# Patient Record
Sex: Male | Born: 1935 | Race: White | Hispanic: No | Marital: Married | Smoking: Never smoker
Health system: Southern US, Community
[De-identification: ages and names within clinical notes are randomized; demographics above are authoritative.]

## PROBLEM LIST (undated history)

## (undated) DIAGNOSIS — I639 Cerebral infarction, unspecified: Secondary | ICD-10-CM

## (undated) DIAGNOSIS — I10 Essential (primary) hypertension: Secondary | ICD-10-CM

## (undated) DIAGNOSIS — F0390 Unspecified dementia without behavioral disturbance: Secondary | ICD-10-CM

---

## 2005-01-23 ENCOUNTER — Ambulatory Visit: Payer: Self-pay | Admitting: Oncology

## 2005-02-19 ENCOUNTER — Ambulatory Visit: Payer: Self-pay | Admitting: Oncology

## 2005-03-06 ENCOUNTER — Ambulatory Visit: Payer: Self-pay | Admitting: Urology

## 2005-08-04 ENCOUNTER — Ambulatory Visit: Payer: Self-pay | Admitting: Oncology

## 2006-01-29 ENCOUNTER — Ambulatory Visit: Payer: Self-pay | Admitting: Oncology

## 2006-02-02 ENCOUNTER — Ambulatory Visit: Payer: Self-pay | Admitting: Oncology

## 2006-02-19 ENCOUNTER — Ambulatory Visit: Payer: Self-pay | Admitting: Oncology

## 2006-03-19 ENCOUNTER — Ambulatory Visit: Payer: Self-pay | Admitting: Urology

## 2006-03-19 ENCOUNTER — Other Ambulatory Visit: Payer: Self-pay

## 2006-03-26 ENCOUNTER — Ambulatory Visit: Payer: Self-pay | Admitting: Urology

## 2006-05-05 ENCOUNTER — Ambulatory Visit: Payer: Self-pay | Admitting: Urology

## 2006-05-12 ENCOUNTER — Ambulatory Visit: Payer: Self-pay | Admitting: Urology

## 2006-05-19 ENCOUNTER — Ambulatory Visit: Payer: Self-pay | Admitting: Urology

## 2006-05-26 ENCOUNTER — Ambulatory Visit: Payer: Self-pay | Admitting: Urology

## 2006-06-09 ENCOUNTER — Ambulatory Visit: Payer: Self-pay | Admitting: Urology

## 2006-06-16 ENCOUNTER — Ambulatory Visit: Payer: Self-pay | Admitting: Urology

## 2006-08-05 ENCOUNTER — Ambulatory Visit: Payer: Self-pay | Admitting: Oncology

## 2006-08-21 ENCOUNTER — Ambulatory Visit: Payer: Self-pay | Admitting: General Surgery

## 2006-08-31 ENCOUNTER — Ambulatory Visit: Payer: Self-pay | Admitting: General Surgery

## 2006-09-01 ENCOUNTER — Ambulatory Visit: Payer: Self-pay | Admitting: General Surgery

## 2007-02-03 ENCOUNTER — Ambulatory Visit: Payer: Self-pay | Admitting: Oncology

## 2007-02-20 ENCOUNTER — Ambulatory Visit: Payer: Self-pay | Admitting: Oncology

## 2007-06-03 ENCOUNTER — Ambulatory Visit: Payer: Self-pay | Admitting: Oncology

## 2007-06-09 ENCOUNTER — Ambulatory Visit: Payer: Self-pay | Admitting: Oncology

## 2007-06-22 ENCOUNTER — Ambulatory Visit: Payer: Self-pay | Admitting: Oncology

## 2007-07-01 ENCOUNTER — Ambulatory Visit: Payer: Self-pay | Admitting: Urology

## 2007-07-01 ENCOUNTER — Other Ambulatory Visit: Payer: Self-pay

## 2007-07-06 ENCOUNTER — Ambulatory Visit: Payer: Self-pay | Admitting: Urology

## 2007-08-11 ENCOUNTER — Ambulatory Visit: Payer: Self-pay | Admitting: Unknown Physician Specialty

## 2007-08-13 ENCOUNTER — Ambulatory Visit: Payer: Self-pay | Admitting: Unknown Physician Specialty

## 2007-09-22 ENCOUNTER — Ambulatory Visit: Payer: Self-pay | Admitting: Oncology

## 2007-10-07 ENCOUNTER — Ambulatory Visit: Payer: Self-pay | Admitting: Oncology

## 2007-10-23 ENCOUNTER — Ambulatory Visit: Payer: Self-pay | Admitting: Oncology

## 2007-11-23 ENCOUNTER — Ambulatory Visit: Payer: Self-pay | Admitting: Urology

## 2008-03-22 ENCOUNTER — Ambulatory Visit: Payer: Self-pay | Admitting: Oncology

## 2008-04-06 ENCOUNTER — Ambulatory Visit: Payer: Self-pay | Admitting: Oncology

## 2008-04-21 ENCOUNTER — Ambulatory Visit: Payer: Self-pay | Admitting: Oncology

## 2008-10-18 ENCOUNTER — Ambulatory Visit: Payer: Self-pay | Admitting: Oncology

## 2008-10-22 ENCOUNTER — Ambulatory Visit: Payer: Self-pay | Admitting: Oncology

## 2008-11-21 ENCOUNTER — Ambulatory Visit: Payer: Self-pay | Admitting: Oncology

## 2008-12-18 ENCOUNTER — Ambulatory Visit: Payer: Self-pay | Admitting: Oncology

## 2008-12-20 ENCOUNTER — Ambulatory Visit: Payer: Self-pay | Admitting: Oncology

## 2008-12-22 ENCOUNTER — Ambulatory Visit: Payer: Self-pay | Admitting: Oncology

## 2010-07-12 ENCOUNTER — Ambulatory Visit: Payer: Self-pay | Admitting: Urology

## 2011-02-03 ENCOUNTER — Inpatient Hospital Stay: Payer: Self-pay | Admitting: Specialist

## 2012-08-24 ENCOUNTER — Emergency Department: Payer: Self-pay | Admitting: Emergency Medicine

## 2013-03-30 ENCOUNTER — Ambulatory Visit: Payer: Self-pay | Admitting: Urology

## 2013-03-30 DIAGNOSIS — I2510 Atherosclerotic heart disease of native coronary artery without angina pectoris: Secondary | ICD-10-CM

## 2013-03-30 LAB — BASIC METABOLIC PANEL
BUN: 25 mg/dL — ABNORMAL HIGH (ref 7–18)
Calcium, Total: 8.8 mg/dL (ref 8.5–10.1)
Co2: 28 mmol/L (ref 21–32)
Creatinine: 1.29 mg/dL (ref 0.60–1.30)
EGFR (African American): 59 — ABNORMAL LOW
Glucose: 113 mg/dL — ABNORMAL HIGH (ref 65–99)
Osmolality: 286 (ref 275–301)
Sodium: 141 mmol/L (ref 136–145)

## 2013-03-30 LAB — CBC WITH DIFFERENTIAL/PLATELET
Basophil #: 0 10*3/uL (ref 0.0–0.1)
Basophil %: 0.5 %
Eosinophil #: 0.4 10*3/uL (ref 0.0–0.7)
Eosinophil %: 5.7 %
Lymphocyte %: 26.2 %
MCH: 31.3 pg (ref 26.0–34.0)
MCV: 93 fL (ref 80–100)
Monocyte %: 9.6 %
Neutrophil %: 58 %
RBC: 4.4 10*6/uL (ref 4.40–5.90)
RDW: 13.3 % (ref 11.5–14.5)

## 2013-04-05 ENCOUNTER — Ambulatory Visit: Payer: Self-pay | Admitting: Urology

## 2015-02-21 ENCOUNTER — Ambulatory Visit: Payer: Self-pay | Admitting: Internal Medicine

## 2015-04-13 NOTE — Op Note (Signed)
PATIENT NAME:  Ryan Payne, Ryan Payne MR#:  [id number] DATE OF BIRTH:  May 25, 1926  DATE OF PROCEDURE:  04/05/2013  PREOPERATIVE DIAGNOSIS: Bulbar urethral stricture.   POSTOPERATIVE DIAGNOSIS: Bulbar urethral stricture.   PROCEDURE: Visual internal urethrotomy; steroid injection.   SURGEON: Dr. Edrick Oh.   ANESTHESIA: Laryngeal mask airway anesthesia.   INDICATIONS: The patient is an 81 year old gentleman with a history of urethral stricture disease. He has had progressive narrowing of a bulbar urethral stricture. Recent cystoscopy with a pediatric scope required dilation for passage. He presents for visual internal urethrotomy with steroid injection of the bulbar stricture.   DESCRIPTION OF PROCEDURE: After informed consent was obtained, the patient was taken to the operating room and placed in the dorsal lithotomy position under laryngeal mask airway anesthesia. The patient was then prepped and draped in the usual standard fashion. An attempt was made at passing the visual [facility] scope. Some narrowing was present at the urethral meatus, requiring dilation with male sounds to 24-French. The scope was then easily passed into the urethra. It was advanced to the level of the bulbar urethra where an approximately 2- to  3-French stricture was encountered. This was just in front of the external sphincter region.   A guidewire was attempted to be passed through the scope. This was unsuccessful due to some angulation. A standard 22-French cystoscope was utilized for guidewire placement. The guidewire barely passed through the opening, but was able to be successfully navigated into the urinary bladder. The [employer] scope was reinserted back into the urethra. An incision was begun at the 12 o'clock position. The stricture was noted to be fairly short, approximately 3 to  4 mm in length. It was at the level of the external sphincter. It was opened to approximately  22-French. Cystoscope was able to be  passed through into the open prostatic urethra. The bladder mucosa was inspected in its entirety. A few glomerulations were present due to the hydrodistention from the initial portion of the procedure. No gross mucosal lesions, however, were visualized. No evidence of recurrent carcinoma in situ or transitional cell carcinoma was appreciated. The right ureteral orifice was identified. The left ureteral orifice was surgically absent, with scar present in its place. No other abnormalities were appreciated. The [employer] scope was removed. The 22-French rigid cystoscope was replaced into the urinary bladder;  80 mg of Kenalog in 3 mL of saline was injected at the stricture site. A 22-French Foley catheter was advanced over the guidewire in a Councell fashion into the urinary bladder without difficulty. The catheter was placed to gravity drainage. There were no problems or complications. The patient tolerated the procedure well. No significant bleeding was encountered.     ____________________________ Denice Bors Jacqlyn Larsen, MD bsc:dm D: 04/05/2013 12:38:21 ET T: 04/05/2013 13:35:51 ET JOB#: [id number]  cc: Denice Bors. Jacqlyn Larsen, MD, <Dictator> Denice Bors Tyria Springer MD ELECTRONICALLY SIGNED 04/06/2013 7:54

## 2015-12-17 ENCOUNTER — Emergency Department (HOSPITAL_COMMUNITY): Payer: Medicare Other

## 2015-12-17 ENCOUNTER — Encounter (HOSPITAL_COMMUNITY): Payer: Self-pay | Admitting: Emergency Medicine

## 2015-12-17 ENCOUNTER — Inpatient Hospital Stay (HOSPITAL_COMMUNITY)
Admission: EM | Admit: 2015-12-17 | Discharge: 2015-12-21 | DRG: 062 | Disposition: A | Discharge status: Home Health | Payer: Medicare Other | Attending: Neurology | Admitting: Neurology

## 2015-12-17 DIAGNOSIS — E669 Obesity, unspecified: Secondary | ICD-10-CM | POA: Diagnosis present

## 2015-12-17 DIAGNOSIS — I4891 Unspecified atrial fibrillation: Secondary | ICD-10-CM

## 2015-12-17 DIAGNOSIS — I63412 Cerebral infarction due to embolism of left middle cerebral artery: Secondary | ICD-10-CM | POA: Diagnosis not present

## 2015-12-17 DIAGNOSIS — I6340 Cerebral infarction due to embolism of unspecified cerebral artery: Principal | ICD-10-CM

## 2015-12-17 DIAGNOSIS — R410 Disorientation, unspecified: Secondary | ICD-10-CM | POA: Diagnosis not present

## 2015-12-17 DIAGNOSIS — I10 Essential (primary) hypertension: Secondary | ICD-10-CM | POA: Diagnosis not present

## 2015-12-17 DIAGNOSIS — F0390 Unspecified dementia without behavioral disturbance: Secondary | ICD-10-CM | POA: Diagnosis not present

## 2015-12-17 DIAGNOSIS — Z0000 Encounter for general adult medical examination without abnormal findings: Secondary | ICD-10-CM

## 2015-12-17 DIAGNOSIS — I639 Cerebral infarction, unspecified: Secondary | ICD-10-CM | POA: Diagnosis not present

## 2015-12-17 DIAGNOSIS — F0391 Unspecified dementia with behavioral disturbance: Secondary | ICD-10-CM | POA: Diagnosis not present

## 2015-12-17 DIAGNOSIS — R269 Unspecified abnormalities of gait and mobility: Secondary | ICD-10-CM | POA: Diagnosis not present

## 2015-12-17 DIAGNOSIS — I481 Persistent atrial fibrillation: Secondary | ICD-10-CM | POA: Diagnosis not present

## 2015-12-17 DIAGNOSIS — Z79899 Other long term (current) drug therapy: Secondary | ICD-10-CM | POA: Diagnosis not present

## 2015-12-17 DIAGNOSIS — R4789 Other speech disturbances: Secondary | ICD-10-CM | POA: Diagnosis present

## 2015-12-17 DIAGNOSIS — I509 Heart failure, unspecified: Secondary | ICD-10-CM

## 2015-12-17 DIAGNOSIS — R29810 Facial weakness: Secondary | ICD-10-CM | POA: Diagnosis present

## 2015-12-17 DIAGNOSIS — E785 Hyperlipidemia, unspecified: Secondary | ICD-10-CM | POA: Diagnosis present

## 2015-12-17 DIAGNOSIS — Z823 Family history of stroke: Secondary | ICD-10-CM

## 2015-12-17 DIAGNOSIS — R471 Dysarthria and anarthria: Secondary | ICD-10-CM | POA: Diagnosis present

## 2015-12-17 DIAGNOSIS — G8191 Hemiplegia, unspecified affecting right dominant side: Secondary | ICD-10-CM | POA: Diagnosis present

## 2015-12-17 DIAGNOSIS — Z6830 Body mass index (BMI) 30.0-30.9, adult: Secondary | ICD-10-CM

## 2015-12-17 DIAGNOSIS — F03918 Unspecified dementia, unspecified severity, with other behavioral disturbance: Secondary | ICD-10-CM | POA: Diagnosis present

## 2015-12-17 DIAGNOSIS — I482 Chronic atrial fibrillation: Secondary | ICD-10-CM | POA: Diagnosis not present

## 2015-12-17 DIAGNOSIS — I6339 Cerebral infarction due to thrombosis of other cerebral artery: Secondary | ICD-10-CM | POA: Diagnosis not present

## 2015-12-17 DIAGNOSIS — I69398 Other sequelae of cerebral infarction: Secondary | ICD-10-CM | POA: Diagnosis not present

## 2015-12-17 HISTORY — DX: Essential (primary) hypertension: I10

## 2015-12-17 HISTORY — DX: Unspecified dementia, unspecified severity, without behavioral disturbance, psychotic disturbance, mood disturbance, and anxiety: F03.90

## 2015-12-17 LAB — I-STAT TROPONIN, ED: Troponin i, poc: 0.09 ng/mL (ref 0.00–0.08)

## 2015-12-17 LAB — COMPREHENSIVE METABOLIC PANEL
ALBUMIN: 3.7 g/dL (ref 3.5–5.0)
ALK PHOS: 49 U/L (ref 38–126)
ALT: 17 U/L (ref 17–63)
AST: 23 U/L (ref 15–41)
Anion gap: 9 (ref 5–15)
BILIRUBIN TOTAL: 0.7 mg/dL (ref 0.3–1.2)
BUN: 17 mg/dL (ref 6–20)
CALCIUM: 9.2 mg/dL (ref 8.9–10.3)
CO2: 25 mmol/L (ref 22–32)
CREATININE: 1.35 mg/dL — AB (ref 0.61–1.24)
Chloride: 105 mmol/L (ref 101–111)
GFR calc Af Amer: 53 mL/min — ABNORMAL LOW (ref >60)
GFR calc non Af Amer: 46 mL/min — ABNORMAL LOW (ref >60)
GLUCOSE: 110 mg/dL — AB (ref 65–99)
Potassium: 3.8 mmol/L (ref 3.5–5.1)
SODIUM: 139 mmol/L (ref 135–145)
TOTAL PROTEIN: 6.2 g/dL — AB (ref 6.5–8.1)

## 2015-12-17 LAB — CBC
HCT: 39.7 % (ref 39.0–52.0)
HEMOGLOBIN: 13.5 g/dL (ref 13.0–17.0)
MCH: 31.1 pg (ref 26.0–34.0)
MCHC: 34 g/dL (ref 30.0–36.0)
MCV: 91.5 fL (ref 78.0–100.0)
PLATELETS: 167 10*3/uL (ref 150–400)
RBC: 4.34 MIL/uL (ref 4.22–5.81)
RDW: 13.4 % (ref 11.5–15.5)
WBC: 5.4 10*3/uL (ref 4.0–10.5)

## 2015-12-17 LAB — URINALYSIS, ROUTINE W REFLEX MICROSCOPIC
BILIRUBIN URINE: NEGATIVE
Glucose, UA: NEGATIVE mg/dL
KETONES UR: 15 mg/dL — AB
Leukocytes, UA: NEGATIVE
NITRITE: NEGATIVE
PROTEIN: NEGATIVE mg/dL
Specific Gravity, Urine: 1.012 (ref 1.005–1.030)
pH: 6 (ref 5.0–8.0)

## 2015-12-17 LAB — I-STAT CHEM 8, ED
BUN: 20 mg/dL (ref 6–20)
CALCIUM ION: 1.11 mmol/L — AB (ref 1.13–1.30)
CHLORIDE: 101 mmol/L (ref 101–111)
CREATININE: 1.3 mg/dL — AB (ref 0.61–1.24)
GLUCOSE: 106 mg/dL — AB (ref 65–99)
HCT: 44 % (ref 39.0–52.0)
Hemoglobin: 15 g/dL (ref 13.0–17.0)
Potassium: 3.7 mmol/L (ref 3.5–5.1)
Sodium: 140 mmol/L (ref 135–145)
TCO2: 25 mmol/L (ref 0–100)

## 2015-12-17 LAB — PROTIME-INR
INR: 1.13 (ref 0.00–1.49)
PROTHROMBIN TIME: 14.7 s (ref 11.6–15.2)

## 2015-12-17 LAB — RAPID URINE DRUG SCREEN, HOSP PERFORMED
AMPHETAMINES: NOT DETECTED
BARBITURATES: NOT DETECTED
BENZODIAZEPINES: NOT DETECTED
Cocaine: NOT DETECTED
Opiates: NOT DETECTED
TETRAHYDROCANNABINOL: NOT DETECTED

## 2015-12-17 LAB — DIFFERENTIAL
Basophils Absolute: 0 10*3/uL (ref 0.0–0.1)
Basophils Relative: 0 %
EOS PCT: 6 %
Eosinophils Absolute: 0.3 10*3/uL (ref 0.0–0.7)
LYMPHS ABS: 1.9 10*3/uL (ref 0.7–4.0)
LYMPHS PCT: 34 %
MONO ABS: 0.4 10*3/uL (ref 0.1–1.0)
Monocytes Relative: 8 %
NEUTROS ABS: 2.8 10*3/uL (ref 1.7–7.7)
NEUTROS PCT: 52 %

## 2015-12-17 LAB — URINE MICROSCOPIC-ADD ON

## 2015-12-17 LAB — ETHANOL: Alcohol, Ethyl (B): <5 mg/dL (ref <5)

## 2015-12-17 LAB — APTT: aPTT: 32 seconds (ref 24–37)

## 2015-12-17 MED ORDER — LABETALOL HCL 5 MG/ML IV SOLN: 10.0000 mg | INTRAVENOUS | Status: DC | PRN

## 2015-12-17 MED ORDER — ALTEPLASE (STROKE) FULL DOSE INFUSION
0.9000 mg/kg | Freq: Once | INTRAVENOUS | Status: AC
  Administered 2015-12-17: 87 mg via INTRAVENOUS
  Filled 2015-12-17: qty 87

## 2015-12-17 MED ORDER — PANTOPRAZOLE SODIUM 40 MG IV SOLR
40.0000 mg | Freq: Every day | INTRAVENOUS | Status: DC
  Administered 2015-12-17: 40 mg via INTRAVENOUS
  Filled 2015-12-17: qty 40

## 2015-12-17 MED ORDER — ACETAMINOPHEN 325 MG PO TABS: 650.0000 mg | ORAL_TABLET | ORAL | Status: DC | PRN

## 2015-12-17 MED ORDER — STROKE: EARLY STAGES OF RECOVERY BOOK
Freq: Once | Status: AC
  Administered 2015-12-17: 19:00:00
  Filled 2015-12-17: qty 1

## 2015-12-17 MED ORDER — ACETAMINOPHEN 650 MG RE SUPP: 650.0000 mg | RECTAL | Status: DC | PRN

## 2015-12-17 MED ORDER — SODIUM CHLORIDE 0.9 % IV SOLN
INTRAVENOUS | Status: DC
  Administered 2015-12-17 – 2015-12-18 (×2): via INTRAVENOUS

## 2015-12-17 NOTE — ED Notes (Signed)
Pt arrives code stroke. LSN 1240. Pt is asphasic and right sided weakness. Pt is alert but unable to speak.

## 2015-12-17 NOTE — ED Notes (Addendum)
Wife at bedside speaking with neurology specific last seen normal.

## 2015-12-17 NOTE — ED Notes (Signed)
Waiting on wife to speak about LSN, en route per EMS

## 2015-12-17 NOTE — ED Notes (Signed)
MD at bedside. Dr. Rochele Pages at bedside speaking with family.

## 2015-12-17 NOTE — ED Provider Notes (Signed)
CSN: [id number]     Arrival date & time 12/17/15  1347 History   First MD Initiated Contact with Patient 12/17/15 1357     Chief Complaint  Patient presents with  . Code Stroke     (Consider location/radiation/quality/duration/timing/severity/associated sxs/prior Treatment) The history is provided by the patient, the EMS personnel and a relative. The history is limited by the condition of the patient.  Patient w hx htn, presents via ems as a code stroke. Pt was last seen normal by family at approximately 67 pm today.  Pt was then noted w inability to ambulate, right sided weakness, and difficulty speaking.  Pt aphasic/dysarthric on arrival to ED - level 5 caveat.           History reviewed. No pertinent past medical history. History reviewed. No pertinent past surgical history. No family history on file. Social History  Substance Use Topics  . Smoking status: Never Smoker   . Smokeless tobacco: None  . Alcohol Use: No        Review of Systems  Constitutional: Negative for fever.  Neurological: Negative for headaches.  level 5 caveat - stroke, dysarthric/aphasic.     Allergies  Review of patient's allergies indicates no known allergies.  Home Medications   Prior to Admission medications   Not on File   BP 180/90 mmHg  Pulse 77  Temp(Src) 98 F (36.7 C) (Oral)  Resp 18  Ht 5\' 8"  (1.727 m)  Wt 96.616 kg  BMI 32.39 kg/m2  SpO2 96% Physical Exam  Constitutional: He appears well-developed and well-nourished. No distress.  HENT:  Head: Atraumatic.  Mouth/Throat: Oropharynx is clear and moist.  Eyes: Conjunctivae are normal. Pupils are equal, round, and reactive to light.  Neck: Neck supple. No tracheal deviation present.  No bruit. No stiffness or rigidity  Cardiovascular: Normal rate, normal heart sounds and intact distal pulses.  Exam reveals no gallop and no friction rub.   No murmur heard. Irregular rhythm.   Pulmonary/Chest: Effort normal and breath  sounds normal. No accessory muscle usage. No respiratory distress.  Abdominal: Soft. Bowel sounds are normal. He exhibits no distension. There is no tenderness.  Musculoskeletal: Normal range of motion. He exhibits no edema or tenderness.  Neurological: He is alert.  Alert. Speech markedly dysarthric, unable to understand. Right weakness, rue 4/5, rle 4/5. Facial asymmetry noted.   Skin: Skin is warm and dry. He is not diaphoretic.  Psychiatric:  Pt appears frustrated w dysarthria.   Nursing note and vitals reviewed.   ED Course  Procedures (including critical care time) Labs Review Labs Reviewed  COMPREHENSIVE METABOLIC PANEL - Abnormal; Notable for the following:    Glucose, Bld 110 (*)    Creatinine, Ser 1.35 (*)    Total Protein 6.2 (*)    GFR calc non Af Amer 46 (*)    GFR calc Af Amer 53 (*)    All other components within normal limits  I-STAT CHEM 8, ED - Abnormal; Notable for the following:    Creatinine, Ser 1.30 (*)    Glucose, Bld 106 (*)    Calcium, Ion 1.11 (*)    All other components within normal limits  I-STAT TROPOININ, ED - Abnormal; Notable for the following:    Troponin i, poc 0.09 (*)    All other components within normal limits  ETHANOL  PROTIME-INR  APTT  CBC  DIFFERENTIAL  URINE RAPID DRUG SCREEN, HOSP PERFORMED  URINALYSIS, ROUTINE W REFLEX MICROSCOPIC (NOT AT [facility])  Imaging Review Ct Head Wo Contrast  12/17/2015  CLINICAL DATA:  Code stroke. Right-sided weakness. Right facial droop and aphasia. EXAM: CT HEAD WITHOUT CONTRAST TECHNIQUE: Contiguous axial images were obtained from the base of the skull through the vertex without intravenous contrast. COMPARISON:  MRI brain 02/21/2015. FINDINGS: Moderate diffuse white matter disease and atrophy is again seen bilaterally. No acute cortical infarct, hemorrhage, or mass lesion is present. The basal ganglia are intact. Insular ribbon is normal. No acute cortical infarct is present. Mild mucosal thickening  is present in the anterior right ethmoid air cells and frontal sinus. There is fluid or soft tissue within the left middle ear. IMPRESSION: 1. No acute intracranial abnormality or significant interval change. 2. Stable moderate atrophy and white matter disease. 3. Mild anterior sinus disease on the right. 4. Fluid or soft tissue in the left middle ear cavity. These results were called by telephone at the time of interpretation on 12/17/2015 at 2:05pm to Dr. Nicole Kindred, who verbally acknowledged these results. Electronically Signed   By: San Morelle M.D.   On: 12/17/2015 14:17   I have personally reviewed and evaluated these images and lab results as part of my medical decision-making.   EKG Interpretation   Date/Time:  Monday December 17 2015 14:07:54 EST Ventricular Rate:  80 PR Interval:    QRS Duration: 107 QT Interval:  410 QTC Calculation: 473 R Axis:   -24 Text Interpretation:  Atrial fibrillation Ventricular premature complex  Confirmed by Ashok Cordia  MD, Lennette Bihari ([id number]) on 12/17/2015 2:16:45 PM      MDM   Iv ns. Continuous pulse ox and monitor. o2 [place]. Stat ct. Code cva called and labs sent.  Neurology, Dr Nicole Kindred, evaluated in ED, and is giving TPA.  Recheck pt, no change from initial exam, no new c/o.   Reviewed nursing notes and prior charts for additional history.   Pt admitted to neurology.       Lajean Saver, MD 12/17/15 [phone]

## 2015-12-17 NOTE — H&P (Signed)
Admission H&P    Chief Complaint: New onset garbled speech and right-sided weakness.  HPI: Ryan Payne is an 81 y.o. male with a history of hypertension and dementia, brought to the emergency room and code stroke status following acute onset of slurred speech and right-sided weakness. Patient was last known well at noon today. He has no previous history of stroke nor TIA. He was found to be in atrial fibrillation after arriving in the ED. He has not been on antiplatelet therapy no anticoagulation. CT scan of his head showed no acute intracranial abnormality. Patient was deemed a candidate for intravenous thrombolytic therapy with TPA, which was administered. Patient was subsequently admitted to neuro intensive care unit for further management.  LSN: 12:00 noon on 12/17/2015 tPA Given: Yes mRankin:  Past medical history: Hypertension Dementia  History reviewed. No pertinent past surgical history.  Family history: Positive for stroke involving 2 sisters as well as his mother.  Social History:  reports that he has never smoked. He does not have any smokeless tobacco history on file. He reports that he does not drink alcohol. His drug history is not on file.  Allergies: No Known Allergies  Medications: Patient's preadmission medications were reviewed by me.  ROS: History obtained from patient spouse and children.  General ROS: negative for - chills, fatigue, fever, night sweats, weight gain or weight loss Psychological ROS: Known dementia of moderate severity Ophthalmic ROS: negative for - blurry vision, double vision, eye pain or loss of vision ENT ROS: negative for - epistaxis, nasal discharge, oral lesions, sore throat, tinnitus or vertigo Allergy and Immunology ROS: negative for - hives or itchy/watery eyes Hematological and Lymphatic ROS: negative for - bleeding problems, bruising or swollen lymph nodes Endocrine ROS: negative for - galactorrhea, hair pattern changes,  polydipsia/polyuria or temperature intolerance Respiratory ROS: negative for - cough, hemoptysis, shortness of breath or wheezing Cardiovascular ROS: negative for - chest pain, dyspnea on exertion, edema or irregular heartbeat Gastrointestinal ROS: negative for - abdominal pain, diarrhea, hematemesis, nausea/vomiting or stool incontinence Genito-Urinary ROS: negative for - dysuria, hematuria, incontinence or urinary frequency/urgency Musculoskeletal ROS: Occasional problems with low back pain Neurological ROS: as noted in HPI Dermatological ROS: negative for rash and skin lesion changes  Physical Examination: Blood pressure 161/84, pulse 75, temperature 98 F (36.7 C), temperature source Oral, resp. rate 16, height '5\' 8"'  (1.727 m), weight 96.616 kg (213 lb), SpO2 96 %.  HEENT-  Normocephalic, no lesions, without obvious abnormality.  Normal external eye and conjunctiva.  Normal TM's bilaterally.  Normal auditory canals and external ears. Normal external nose, mucus membranes and septum.  Normal pharynx. Neck supple with no masses, nodes, nodules or enlargement. Cardiovascular - irregularly irregular rhythm, S1, S2 normal and no S3 or S4 Lungs - chest clear, no wheezing, rales, normal symmetric air entry Abdomen - soft, non-tender; bowel sounds normal; no masses,  no organomegaly Extremities - no joint deformities, effusion, or inflammation  Neurologic Examination: Mental Status: Alert, markedly slurred speech without aphasia. Able to follow commands without difficulty. Cranial Nerves: II-Visual fields were normal. III/IV/VI-Pupils were equal and reacted. Extraocular movements were full and conjugate.    V/VII-no facial numbness; moderately severe right lower facial weakness. VIII-normal. Anselmo Pickler was markedly dysarthric XI: trapezius strength/neck flexion strength normal bilaterally XII-midline tongue extension with normal strength. Motor: Mild drift of right upper and lower  extremities; motor exam otherwise unremarkable Sensory: Normal throughout. Deep Tendon Reflexes: 1+ and symmetric. Plantars: Mute bilaterally Cerebellar: Normal finger-to-nose testing.  Carotid auscultation: Normal  Results for orders placed or performed during the hospital encounter of 12/17/15 (from the past 48 hour(s))  Ethanol     Status: None   Collection Time: 12/17/15  1:50 PM  Result Value Ref Range   Alcohol, Ethyl (B) <5 <5 mg/dL    Comment:        LOWEST DETECTABLE LIMIT FOR SERUM ALCOHOL IS 5 mg/dL FOR MEDICAL PURPOSES ONLY   Protime-INR     Status: None   Collection Time: 12/17/15  1:50 PM  Result Value Ref Range   Prothrombin Time 14.7 11.6 - 15.2 seconds   INR 1.13 0.00 - 1.49  APTT     Status: None   Collection Time: 12/17/15  1:50 PM  Result Value Ref Range   aPTT 32 24 - 37 seconds  CBC     Status: None   Collection Time: 12/17/15  1:50 PM  Result Value Ref Range   WBC 5.4 4.0 - 10.5 K/uL   RBC 4.34 4.22 - 5.81 MIL/uL   Hemoglobin 13.5 13.0 - 17.0 g/dL   HCT 39.7 39.0 - 52.0 %   MCV 91.5 78.0 - 100.0 fL   MCH 31.1 26.0 - 34.0 pg   MCHC 34.0 30.0 - 36.0 g/dL   RDW 13.4 11.5 - 15.5 %   Platelets 167 150 - 400 K/uL  Differential     Status: None   Collection Time: 12/17/15  1:50 PM  Result Value Ref Range   Neutrophils Relative % 52 %   Neutro Abs 2.8 1.7 - 7.7 K/uL   Lymphocytes Relative 34 %   Lymphs Abs 1.9 0.7 - 4.0 K/uL   Monocytes Relative 8 %   Monocytes Absolute 0.4 0.1 - 1.0 K/uL   Eosinophils Relative 6 %   Eosinophils Absolute 0.3 0.0 - 0.7 K/uL   Basophils Relative 0 %   Basophils Absolute 0.0 0.0 - 0.1 K/uL  Comprehensive metabolic panel     Status: Abnormal   Collection Time: 12/17/15  1:50 PM  Result Value Ref Range   Sodium 139 135 - 145 mmol/L   Potassium 3.8 3.5 - 5.1 mmol/L   Chloride 105 101 - 111 mmol/L   CO2 25 22 - 32 mmol/L   Glucose, Bld 110 (H) 65 - 99 mg/dL   BUN 17 6 - 20 mg/dL   Creatinine, Ser 1.35 (H) 0.61 - 1.24  mg/dL   Calcium 9.2 8.9 - 10.3 mg/dL   Total Protein 6.2 (L) 6.5 - 8.1 g/dL   Albumin 3.7 3.5 - 5.0 g/dL   AST 23 15 - 41 U/L   ALT 17 17 - 63 U/L   Alkaline Phosphatase 49 38 - 126 U/L   Total Bilirubin 0.7 0.3 - 1.2 mg/dL   GFR calc non Af Amer 46 (L) >60 mL/min   GFR calc Af Amer 53 (L) >60 mL/min    Comment: (NOTE) The eGFR has been calculated using the CKD EPI equation. This calculation has not been validated in all clinical situations. eGFR's persistently <60 mL/min signify possible Chronic Kidney Disease.    Anion gap 9 5 - 15  I-stat troponin, ED (not at [facility], [facility])     Status: Abnormal   Collection Time: 12/17/15  1:54 PM  Result Value Ref Range   Troponin i, poc 0.09 (HH) 0.00 - 0.08 ng/mL   Comment NOTIFIED PHYSICIAN    Comment 3            Comment: Due  to the release kinetics of cTnI, a negative result within the first hours of the onset of symptoms does not rule out myocardial infarction with certainty. If myocardial infarction is still suspected, repeat the test at appropriate intervals.   I-Stat Chem 8, ED  (not at [facility], [facility])     Status: Abnormal   Collection Time: 12/17/15  1:56 PM  Result Value Ref Range   Sodium 140 135 - 145 mmol/L   Potassium 3.7 3.5 - 5.1 mmol/L   Chloride 101 101 - 111 mmol/L   BUN 20 6 - 20 mg/dL   Creatinine, Ser 1.30 (H) 0.61 - 1.24 mg/dL   Glucose, Bld 106 (H) 65 - 99 mg/dL   Calcium, Ion 1.11 (L) 1.13 - 1.30 mmol/L   TCO2 25 0 - 100 mmol/L   Hemoglobin 15.0 13.0 - 17.0 g/dL   HCT 44.0 39.0 - 52.0 %   Ct Head Wo Contrast  12/17/2015  CLINICAL DATA:  Code stroke. Right-sided weakness. Right facial droop and aphasia. EXAM: CT HEAD WITHOUT CONTRAST TECHNIQUE: Contiguous axial images were obtained from the base of the skull through the vertex without intravenous contrast. COMPARISON:  MRI brain 02/21/2015. FINDINGS: Moderate diffuse white matter disease and atrophy is again seen bilaterally. No acute cortical infarct, hemorrhage, or  mass lesion is present. The basal ganglia are intact. Insular ribbon is normal. No acute cortical infarct is present. Mild mucosal thickening is present in the anterior right ethmoid air cells and frontal sinus. There is fluid or soft tissue within the left middle ear. IMPRESSION: 1. No acute intracranial abnormality or significant interval change. 2. Stable moderate atrophy and white matter disease. 3. Mild anterior sinus disease on the right. 4. Fluid or soft tissue in the left middle ear cavity. These results were called by telephone at the time of interpretation on 12/17/2015 at 2:05pm to Dr. Nicole Kindred, who verbally acknowledged these results. Electronically Signed   By: San Morelle M.D.   On: 12/17/2015 14:17    Assessment: 81 y.o. male with multiple risk factors for stroke presenting with new onset right-sided weakness as well as marked dysarthria and right facial weakness, most likely secondary to left subcortical ischemic stroke.  Stroke Risk Factors - atrial fibrillation, family history and hypertension  Plan: 1. Post TPA management protocol with admission to neuro intensive care unit 2. MRI, MRA  of the brain without contrast 3. PT consult, OT consult, Speech consult 4. Echocardiogram 5. Carotid dopplers 6. Prophylactic therapy-Antiplatelet med: Aspirin or possible anticoagulation (per stroke team) 7. Risk factor modification 8. HgbA1c, fasting lipid panel  This patient is critically ill and at significant risk of neurological worsening or death, and care requires constant monitoring of vital signs, hemodynamics,respiratory and cardiac monitoring, neurological assessment, discussion with family, other specialists and medical decision making of high complexity. Total critical care time was 90 minutes.  C.R. Nicole Kindred, MD [facility] [phone]  12/17/2015, 3:00 PM

## 2015-12-18 ENCOUNTER — Inpatient Hospital Stay (HOSPITAL_COMMUNITY): Payer: Medicare Other

## 2015-12-18 DIAGNOSIS — R410 Disorientation, unspecified: Secondary | ICD-10-CM

## 2015-12-18 DIAGNOSIS — I639 Cerebral infarction, unspecified: Secondary | ICD-10-CM

## 2015-12-18 DIAGNOSIS — E785 Hyperlipidemia, unspecified: Secondary | ICD-10-CM

## 2015-12-18 DIAGNOSIS — F0391 Unspecified dementia with behavioral disturbance: Secondary | ICD-10-CM

## 2015-12-18 LAB — LIPID PANEL
CHOL/HDL RATIO: 2.9 ratio
CHOLESTEROL: 183 mg/dL (ref 0–200)
HDL: 63 mg/dL (ref >40)
LDL Cholesterol: 110 mg/dL — ABNORMAL HIGH (ref 0–99)
TRIGLYCERIDES: 50 mg/dL (ref <150)
VLDL: 10 mg/dL (ref 0–40)

## 2015-12-18 MED ORDER — HALOPERIDOL LACTATE 5 MG/ML IJ SOLN
INTRAMUSCULAR | Status: AC
  Administered 2015-12-18: 5 mg
  Filled 2015-12-18: qty 1

## 2015-12-18 MED ORDER — LORAZEPAM 2 MG/ML IJ SOLN
1.0000 mg | Freq: Once | INTRAMUSCULAR | Status: AC
  Administered 2015-12-18: 1 mg via INTRAVENOUS

## 2015-12-18 MED ORDER — LABETALOL HCL 5 MG/ML IV SOLN: 10.0000 mg | INTRAVENOUS | Status: DC | PRN

## 2015-12-18 MED ORDER — ENOXAPARIN SODIUM 40 MG/0.4ML ~~LOC~~ SOLN
40.0000 mg | SUBCUTANEOUS | Status: DC
  Filled 2015-12-18: qty 0.4

## 2015-12-18 MED ORDER — QUETIAPINE FUMARATE 25 MG PO TABS
25.0000 mg | ORAL_TABLET | Freq: Every day | ORAL | Status: DC
  Administered 2015-12-18: 25 mg via ORAL
  Filled 2015-12-18 (×2): qty 1

## 2015-12-18 MED ORDER — LORAZEPAM 2 MG/ML IJ SOLN
INTRAMUSCULAR | Status: AC
  Filled 2015-12-18: qty 1

## 2015-12-18 MED ORDER — DONEPEZIL HCL 10 MG PO TBDP: 10.0000 mg | ORAL_TABLET | Freq: Every day | ORAL | Status: DC

## 2015-12-18 MED ORDER — HALOPERIDOL LACTATE 5 MG/ML IJ SOLN
2.5000 mg | Freq: Once | INTRAMUSCULAR | Status: AC
  Administered 2015-12-18: 2.5 mg via INTRAVENOUS
  Filled 2015-12-18: qty 1

## 2015-12-18 MED ORDER — ASPIRIN 325 MG PO TABS
325.0000 mg | ORAL_TABLET | Freq: Every day | ORAL | Status: DC
  Administered 2015-12-19: 325 mg via ORAL
  Filled 2015-12-18: qty 1

## 2015-12-18 MED ORDER — QUETIAPINE FUMARATE 25 MG PO TABS
25.0000 mg | ORAL_TABLET | Freq: Two times a day (BID) | ORAL | Status: DC
  Administered 2015-12-18: 25 mg via ORAL
  Filled 2015-12-18 (×2): qty 1

## 2015-12-18 MED ORDER — PANTOPRAZOLE SODIUM 40 MG PO TBEC
40.0000 mg | DELAYED_RELEASE_TABLET | Freq: Every day | ORAL | Status: DC
  Administered 2015-12-19 – 2015-12-21 (×3): 40 mg via ORAL
  Filled 2015-12-18 (×3): qty 1

## 2015-12-18 MED ORDER — DONEPEZIL HCL 10 MG PO TABS
10.0000 mg | ORAL_TABLET | Freq: Every day | ORAL | Status: DC
  Administered 2015-12-18 – 2015-12-20 (×3): 10 mg via ORAL
  Filled 2015-12-18 (×4): qty 1

## 2015-12-18 MED ORDER — ATORVASTATIN CALCIUM 10 MG PO TABS
20.0000 mg | ORAL_TABLET | Freq: Every day | ORAL | Status: DC
  Administered 2015-12-19 – 2015-12-21 (×2): 20 mg via ORAL
  Filled 2015-12-18 (×3): qty 2

## 2015-12-18 NOTE — Progress Notes (Signed)
OT Cancellation Note  Patient Details Name: Ryan Payne MRN: [id number] DOB: Aug 31, 1926   Cancelled Treatment:    Reason Eval/Treat Not Completed: Patient not medically ready (strict bedrest s/p TPA) OT order received and appreciated however this conflicts with current bedrest order set. Please increase activity tolerance as appropriate and remove bedrest from orders. . Please contact OT at [phone] if bed rest order is discontinued. OT will hold evaluation at this time and will check back as time allows pending increased activity orders.   Peri Maris  [phone] 12/18/2015, 7:00 AM

## 2015-12-18 NOTE — Care Management Note (Signed)
Case Management Note  Patient Details  Name: Ryan Payne MRN: [id number] Date of Birth: 04/15/1926  Subjective/Objective:   Pt admitted on 12/17/15 s/p CVA.  PTA, pt independent, lives with wife.  Pt has hx of early dementia.                   Action/Plan: Will follow for discharge planning as pt progresses.  PT/OT consults pending.    Expected Discharge Date:                  Expected Discharge Plan:  IP Rehab Facility  In-House Referral:  Clinical Social Work  Discharge planning Services  CM Consult  Post Acute Care Choice:    Choice offered to:     DME Arranged:    DME Agency:     HH Arranged:    [place] Agency:     Status of Service:  In process, will continue to follow  Medicare Important Message Given:    Date Medicare IM Given:    Medicare IM give by:    Date Additional Medicare IM Given:    Additional Medicare Important Message give by:     If discussed at [place] of Stay Meetings, dates discussed:    Additional Comments:  Reinaldo Raddle, RN, BSN  Trauma/Neuro ICU Case Manager [phone]

## 2015-12-18 NOTE — Progress Notes (Signed)
PT Cancellation Note  Patient Details Name: Ryan Payne MRN: [id number] DOB: [birth year]   Cancelled Treatment:    Reason Eval/Treat Not Completed: Patient not medically ready.  Pt on bedrest post-tpa.  Please advance activity order once appropriate for PT and mobility.     Catarina Hartshorn, [place] 12/18/2015, 8:10 AM

## 2015-12-18 NOTE — Progress Notes (Signed)
Pt physically and verbally aggressive towards staff, still trying to hit and kick with 4 point restraints on.  MD notified and ordered Haldol, see MAR.  Haldol did calm pt down and RN transported with pt to MRI.  Will continue to monitor.  Fredrich Romans, RN

## 2015-12-18 NOTE — Progress Notes (Signed)
*  PRELIMINARY RESULTS* Vascular Ultrasound Carotid Duplex (Doppler) has been completed.   Study was technically difficult due to poor patient cooperation. Findings suggest 1-39% internal carotid artery stenosis bilaterally. The right vertebral artery is patent with antegrade flow. Unable to visualize the left vertebral artery.  12/18/2015 3:12 PM Maudry Mayhew, RVT, RDCS, RDMS

## 2015-12-18 NOTE — Clinical Documentation Improvement (Signed)
Neurology  Based on the clinical findings below, please document any associated diagnoses/conditions the patient has or may have.   Dementia with behavorial disturbances  Dementia without behavorial disturbances  Other  Clinically Undetermined  Supporting Information: Progress note 12/17/15:" Past medical history:Dementia"  Progress note 12/18/15:"Wife stated that at home he takes aricept and doing well with only early dementia." today pt seems to be agitated, asking to walk in the hallway, trying to pull lines. Put on soft restraint  Echo Lab 12/18/15:"2D Echocardiogram has been ATTEMPTED. Patient attempted to hit me after first image. Reorder if needed when cooperative."   Please exercise your independent, professional judgment when responding. A specific answer is not anticipated or expected. Please update your documentation within the medical record to reflect your response to this query. Thank you  Thank You, [place] [phone]

## 2015-12-18 NOTE — Evaluation (Signed)
Clinical/Bedside Swallow Evaluation Patient Details  Name: Ryan Payne MRN: [id number] Date of Birth: February 19, 1926  Today's Date: 12/18/2015 Time: SLP Start Time (ACUTE ONLY): [id number]      Past Medical History: History reviewed. No pertinent past medical history. Past Surgical History: History reviewed. No pertinent past surgical history. HPI:  81 y.o. male with a history of hypertension and dementia, brought to the emergency room and code stroke status following acute onset of slurred speech and right-sided weakness. CT negative for acute event; tPA administered.    Assessment / Plan / Recommendation Clinical Impression  Pt presents with an acute dysphagia, likely secondary to confusion - there are no focal CN deficits; swallow response appears to be timely.  Recommend initiating a dysphagia 2 diet with thin liquids, meds whole in puree.  Full supervision given MS changes.  SLP will follow for toleration and readiness for diet advancement.     Aspiration Risk  Mild aspiration risk    Diet Recommendation     Medication Administration: Whole meds with puree    Other  Recommendations Oral Care Recommendations: Oral care BID   Follow up Recommendations   (tba)    Frequency and Duration min 2x/week  2 weeks       Prognosis Prognosis for Safe Diet Advancement: Good      Swallow Study   General Date of Onset: 12/17/15 HPI: 81 y.o. male with a history of hypertension and dementia, brought to the emergency room and code stroke status following acute onset of slurred speech and right-sided weakness. CT negative for acute event; tPA administered.  Type of Study: Bedside Swallow Evaluation Previous Swallow Assessment: none per records Diet Prior to this Study: NPO Temperature Spikes Noted: No Respiratory Status: Room air History of Recent Intubation: No Behavior/Cognition: Confused;Agitated Oral Cavity Assessment: Within Functional Limits Oral Cavity - Dentition: Dentures,  top;Dentures, bottom Vision: Functional for self-feeding Self-Feeding Abilities: Able to feed self;Needs assist Patient Positioning: Upright in bed Baseline Vocal Quality: Normal Volitional Cough: Cognitively unable to elicit Volitional Swallow: Unable to elicit    Oral/Motor/Sensory Function Overall Oral Motor/Sensory Function: Within functional limits   Ice Chips Ice chips: Within functional limits Presentation: Spoon   Thin Liquid Thin Liquid: Impaired Presentation: Cup (cough near end of assessment when HOB lowered) Pharyngeal  Phase Impairments: Suspected delayed Swallow    Nectar Thick Nectar Thick Liquid: Not tested   Honey Thick Honey Thick Liquid: Not tested   Puree Puree: Within functional limits Presentation: Spoon   Solid Solid: Not tested       Juan Quam Laurice 12/18/2015,10:05 AM

## 2015-12-18 NOTE — Progress Notes (Addendum)
  Echocardiogram 2D Echocardiogram has been ATTEMPTED. Patient attempted to hit me after first image. Reorder if needed when cooperative.  Ryan Payne 12/18/2015, 9:35 AM

## 2015-12-18 NOTE — Progress Notes (Addendum)
STROKE TEAM PROGRESS NOTE   HISTORY Ryan Payne is an 81 y.o. male with a history of hypertension and dementia, brought to the emergency room and code stroke status following acute onset of slurred speech and right-sided weakness. Patient was last known well at noon today 12/17/2015 ([facility]). No previous history of stroke nor TIA. He was found to be in atrial fibrillation after arriving in the ED. He has not been on antiplatelet therapy no anticoagulation. CT scan of his head showed no acute intracranial abnormality. Patient was deemed a candidate for intravenous thrombolytic therapy with TPA, which was administered. Patient was subsequently admitted to neuro intensive care unit for further management.   SUBJECTIVE (INTERVAL HISTORY) His wife is at the bedside.  Wife stated from 5am today pt seems to be agitated, asking to walk in the hallway, trying to pull lines. Put on soft restraint. Moving all extremities strongly, no facial asymmetry. Dysarthric, no change from admission. Not cooperative with TTE. Doubt will able to do MRI. Wife stated that at home he takes aricept and doing well with only early dementia. Able to walk without falls.    OBJECTIVE Temp:  [98 F (36.7 C)-98.6 F (37 C)] 98.3 F (36.8 C) (12/27 0400) Pulse Rate:  [68-96] 96 (12/27 0600) Cardiac Rhythm:  [-]  Resp:  [7-26] 24 (12/27 0700) BP: (125-180)/(59-115) 177/77 mmHg (12/27 0700) SpO2:  [94 %-99 %] 96 % (12/27 0600) Weight:  [90.8 kg (200 lb 2.8 oz)-96.616 kg (213 lb)] 90.8 kg (200 lb 2.8 oz) (12/26 1545)  CBC:  Recent Labs Lab 12/17/15 1350 12/17/15 1356  WBC 5.4  --   NEUTROABS 2.8  --   HGB 13.5 15.0  HCT 39.7 44.0  MCV 91.5  --   PLT 167  --     Basic Metabolic Panel:  Recent Labs Lab 12/17/15 1350 12/17/15 1356  NA 139 140  K 3.8 3.7  CL 105 101  CO2 25  --   GLUCOSE 110* 106*  BUN 17 20  CREATININE 1.35* 1.30*  CALCIUM 9.2  --     Lipid Panel:    Component Value Date/Time   CHOL 183  12/18/2015 0210   TRIG 50 12/18/2015 0210   HDL 63 12/18/2015 0210   CHOLHDL 2.9 12/18/2015 0210   VLDL 10 12/18/2015 0210   LDLCALC 110* 12/18/2015 0210   HgbA1c: No results found for: HGBA1C Urine Drug Screen:    Component Value Date/Time   LABOPIA NONE DETECTED 12/17/2015 1459   COCAINSCRNUR NONE DETECTED 12/17/2015 1459   LABBENZ NONE DETECTED 12/17/2015 1459   AMPHETMU NONE DETECTED 12/17/2015 1459   THCU NONE DETECTED 12/17/2015 1459   LABBARB NONE DETECTED 12/17/2015 1459      IMAGING I have personally reviewed the radiological images below and agree with the radiology interpretations.  Ct Head Wo Contrast  12/17/2015  IMPRESSION: 1. No acute intracranial abnormality or significant interval change. 2. Stable moderate atrophy and white matter disease. 3. Mild anterior sinus disease on the right. 4. Fluid or soft tissue in the left middle ear cavity. 7   2D echo - pending  CUS - pending  EKG - afib   PHYSICAL EXAM  Temp:  [98 F (36.7 C)-98.6 F (37 C)] 98.1 F (36.7 C) (12/27 0730) Pulse Rate:  [68-109] 109 (12/27 0800) Resp:  [7-26] 25 (12/27 0800) BP: (125-180)/(59-135) 165/135 mmHg (12/27 0800) SpO2:  [94 %-99 %] 96 % (12/27 0800) Weight:  [200 lb 2.8 oz (90.8 kg)-213 lb (  96.616 kg)] 200 lb 2.8 oz (90.8 kg) (12/26 1545)  General - Well nourished, well developed, mildly agitated, trying to get off restraints.  Ophthalmologic - Fundi not visualized due to noncooperation.  Cardiovascular - irregularly irregular heart rate and rhythm.  Mental Status -  Awake alert but not cooperative on orientation questions. Language fluent output, not following commands, not cooperative on naming or repetition, moderate to severe dysarthria.  Cranial Nerves II - XII - II - blinking to visual threat bilaterally. III, IV, VI - Extraocular movements intact. V - not cooperative. VII - Facial movement intact bilaterally. VIII - Hearing & vestibular intact bilaterally. X  - Palate elevates symmetrically, moderate to severe dysarthria. XI - not cooperative. XII - not cooperative.  Motor Strength - The patient moving all extremities strongly trying to get off restraint, but not cooperative on commands.  Bulk was normal and fasciculations were absent.   Motor Tone - Muscle tone was assessed at the neck and appendages and was normal.  Reflexes - The patient's reflexes were 1+ in all extremities and he had no pathological reflexes.  Sensory - not cooperative on exam.    Coordination - not cooperative.  Tremor was absent.  Gait and Station - not tested.   ASSESSMENT/PLAN Ryan Payne is a 81 y.o. male with history of hypertension and dementia on aricept presenting with slurred speech and right-sided weakness. He received IV t-PA 12/17/2015 at 1426.   Stroke:  Likely left hemisphere infarct, likely embolic secondary to new onset atrial fibrillation   Resultant  delirium  MRI  pending  MRA  pending  Carotid Doppler  pending  2D Echo  pending  LDL 110  HgbA1c pending  SCDs for VTE prophylaxis  Diet NPO time specified  No antithrombotic prior to admission, now on No antithrombotic as within 24h of tPA. Add aspirin if 24h post tPA imaging negative for hemorrhage.  Ongoing aggressive stroke risk factor management  Therapy recommendations:  pending  Disposition:  pending  Atrial Fibrillation, new onset  Home anticoagulation:  none  CHA2DS2-VASc Score = 5, ?2 oral anticoagulation recommended  Age in Years:  ?17   +2    Sex:  Male   0  Hypertension History:  yes   +1     Diabetes Mellitus:  0   Congestive Heart Failure History:  0  Vascular Disease History:  0     Stroke/TIA/Thromboembolism History:  yes   +2  Consider anticoagulation depending on MRI findings  Had outpt cardiology follow up  Delirium   Will resume aricept after passing swallow  Will need seroquel likely  Soft restraint  May not able to do MRI until  cooperative  Dementia with behavior disturbance  Baseline dementia  Resume home aricept after swallow  More prone to have hospital delirium  seroquel 25mg  Qhs     Hypertension  Stable  Hyperlipidemia  Home meds:  No statin  LDL 110, goal < 70  Add statin after passing swallow  Continue statin at discharge  Other Stroke Risk Factors  Advanced age  Obesity, Body mass index is 30.44 kg/(m^2).   Family hx stroke (2 sisters and mother)  Other Active Problems  Baseline dementia  Hospital day # 1  This patient is critically ill due to code stroke s/p tPA, delirium and at significant risk of neurological worsening, death form hemorrhage after tPA, hemorrhagic transformation, brain herniation. This patient's care requires constant monitoring of vital signs, hemodynamics, respiratory and cardiac monitoring,  review of multiple databases, neurological assessment, discussion with family, other specialists and medical decision making of high complexity. I spent 40 minutes of neurocritical care time in the care of this patient.  Rosalin Hawking, MD PhD Stroke Neurology 12/18/2015 10:00 AM    To contact Stroke Continuity provider, please refer to [email or web address] After hours, contact General Neurology

## 2015-12-19 DIAGNOSIS — I63412 Cerebral infarction due to embolism of left middle cerebral artery: Secondary | ICD-10-CM

## 2015-12-19 DIAGNOSIS — I6339 Cerebral infarction due to thrombosis of other cerebral artery: Secondary | ICD-10-CM

## 2015-12-19 DIAGNOSIS — R269 Unspecified abnormalities of gait and mobility: Secondary | ICD-10-CM

## 2015-12-19 DIAGNOSIS — I69398 Other sequelae of cerebral infarction: Secondary | ICD-10-CM

## 2015-12-19 DIAGNOSIS — I481 Persistent atrial fibrillation: Secondary | ICD-10-CM

## 2015-12-19 LAB — HEMOGLOBIN A1C
HEMOGLOBIN A1C: 5.9 % — AB (ref 4.8–5.6)
Mean Plasma Glucose: 123 mg/dL

## 2015-12-19 MED ORDER — APIXABAN 5 MG PO TABS
5.0000 mg | ORAL_TABLET | Freq: Two times a day (BID) | ORAL | Status: DC
  Administered 2015-12-19 – 2015-12-21 (×5): 5 mg via ORAL
  Filled 2015-12-19 (×5): qty 1

## 2015-12-19 MED ORDER — QUETIAPINE FUMARATE 50 MG PO TABS
50.0000 mg | ORAL_TABLET | Freq: Every day | ORAL | Status: DC
  Administered 2015-12-19 – 2015-12-20 (×2): 50 mg via ORAL
  Filled 2015-12-19 (×2): qty 1

## 2015-12-19 NOTE — Progress Notes (Signed)
STROKE TEAM PROGRESS NOTE   SUBJECTIVE (INTERVAL HISTORY) Patient up in chair at bedside. Sleepy and in restraints. However, wife said he ate well with breakfast and PT/OT stated that he walked in hallway with them seems in high function. He is orientated to people and place this am.    OBJECTIVE Temp:  [98 F (36.7 C)-100 F (37.8 C)] 98.6 F (37 C) (12/28 0933) Pulse Rate:  [75-111] 75 (12/28 0933) Cardiac Rhythm:  [-] Atrial fibrillation (12/28 0846) Resp:  [10-20] 20 (12/28 0933) BP: (96-155)/(51-99) 118/99 mmHg (12/28 0933) SpO2:  [96 %-100 %] 96 % (12/28 0933)  CBC:   Recent Labs Lab 12/17/15 1350 12/17/15 1356  WBC 5.4  --   NEUTROABS 2.8  --   HGB 13.5 15.0  HCT 39.7 44.0  MCV 91.5  --   PLT 167  --     Basic Metabolic Panel:   Recent Labs Lab 12/17/15 1350 12/17/15 1356  NA 139 140  K 3.8 3.7  CL 105 101  CO2 25  --   GLUCOSE 110* 106*  BUN 17 20  CREATININE 1.35* 1.30*  CALCIUM 9.2  --     Lipid Panel:     Component Value Date/Time   CHOL 183 12/18/2015 0210   TRIG 50 12/18/2015 0210   HDL 63 12/18/2015 0210   CHOLHDL 2.9 12/18/2015 0210   VLDL 10 12/18/2015 0210   LDLCALC 110* 12/18/2015 0210   HgbA1c:  Lab Results  Component Value Date   HGBA1C 5.9* 12/18/2015   Urine Drug Screen:     Component Value Date/Time   LABOPIA NONE DETECTED 12/17/2015 1459   COCAINSCRNUR NONE DETECTED 12/17/2015 1459   LABBENZ NONE DETECTED 12/17/2015 1459   AMPHETMU NONE DETECTED 12/17/2015 1459   THCU NONE DETECTED 12/17/2015 1459   LABBARB NONE DETECTED 12/17/2015 1459      IMAGING I have personally reviewed the radiological images below and agree with the radiology interpretations.  Ct Head Wo Contrast 12/17/2015   1. No acute intracranial abnormality or significant interval change. 2. Stable moderate atrophy and white matter disease. 3. Mild anterior sinus disease on the right. 4. Fluid or soft tissue in the left middle ear cavity. 7  12/18/2015    No intracranial hemorrhage or evolving infarct identified. Stable noncontrast CT appearance of the brain.   MRI HEAD 12/18/2015   Small areas of acute ischemia LEFT frontal lobe/MCA territory. Involutional changes. Moderate chronic small vessel ischemic disease.   MRA HEAD 12/18/2015   Mild motion degraded examination. No acute large vessel occlusion or high-grade stenosis. Moderate stenosis RIGHT M2 segment, mild stenosis LEFT M2 segment.    2D echo  - Left ventricle: The cavity size was normal. Systolic function wasnormal. The estimated ejection fraction was in the range of 55%to 60%.  Impressions:   Only 2 parasternal images obtained as patient combative; LVfunction appears to be normal on limited views; no pericardialeffusion; no doppler performed; suggest repeat study when patientmore cooperative.  CUS  Study was technically difficult due to poor patient cooperation. Findings suggest 1-39% internal carotid artery stenosis bilaterally. The right vertebral artery is patent with antegrade flow. Unable to visualize the left vertebral artery.  EKG - afib   PHYSICAL EXAM General - Well nourished, well developed, calm in restraints.  Ophthalmologic - Fundi not visualized due to noncooperation.  Cardiovascular - irregularly irregular heart rate and rhythm.  Mental Status -  Awake alert, orientated to place and people but not to time. Language  fluent output, follows simple commands, not cooperative on naming or repetition, moderate to severe dysarthria. Fund of knowledge impaired.  Cranial Nerves II - XII - II - blinking to visual threat bilaterally. III, IV, VI - Extraocular movements intact. V - not cooperative. VII - Facial movement intact bilaterally. VIII - Hearing & vestibular intact bilaterally. X - Palate elevates symmetrically, moderate to severe dysarthria. XI - not cooperative. XII - not cooperative.  Motor Strength - The patient moving all extremities  symmetrically, but not cooperative on commands.  Bulk was normal and fasciculations were absent.   Motor Tone - Muscle tone was assessed at the neck and appendages and was normal.  Reflexes - The patient's reflexes were 1+ in all extremities and he had no pathological reflexes.  Sensory - symmetrical bilaterally as per pt.    Coordination - not cooperative.  Tremor was absent.  Gait and Station - not tested, deferred to PT/OT.   ASSESSMENT/PLAN [name] is a 81 y.o. male with history of hypertension and dementia on aricept presenting with slurred speech and right-sided weakness. He received IV t-PA 12/17/2015 at 1426.   Stroke:  left frontal lobe/MCA territory infarcts, embolic secondary to new onset atrial fibrillation   Resultant  Delirium improved  MRI  L frontal lobe/MCA territory infarcts  MRA  Stenosis bilateral M2s   Carotid Doppler  No significant stenosis   2D Echo  EF 55-60%  LDL 110  HgbA1c 5.9  SCDs for VTE prophylaxis DIET DYS 2 Room service appropriate?: Yes; Fluid consistency:: Thin  No antithrombotic prior to admission, started on aspirin 325 mg daily post tPA. Given atrial fibrillation, will change to NOAC. See below.  Ongoing aggressive stroke risk factor management  Therapy recommendations:  CIR (may be too good). Family supportive.  Disposition:  pending  Atrial Fibrillation, new onset  Home anticoagulation:  none  CHA2DS2-VASc Score = 5, ?2 oral anticoagulation recommended  Age in Years:  ?81   +2    Sex:  Male   0  Hypertension History:  yes   +1     Diabetes Mellitus:  0   Congestive Heart Failure History:  0  Vascular Disease History:  0     Stroke/TIA/Thromboembolism History:  yes   +2  Given small size of stroke, change aspirin to anticoagulation, eliquis 5 mg bid  Had outpt cardiology follow up  Delirium   Resumed aricept   Added seroquel, increase to 50mg  Qhs  Soft restraint  Dementia with behavioral  disturbance  Baseline dementia  Resumed home aricept   More prone to have hospital delirium  seroquel 25mg  Qhs. Will increase to 50 mg.    Hypertension  Stable  Hyperlipidemia  Home meds:  No statin  LDL 110, goal < 70  Add lipitor 20mg  daily  Continue statin at discharge  Other Stroke Risk Factors  Advanced age  Obesity, Body mass index is 30.44 kg/(m^2).   Family hx stroke (2 sisters and mother)  Other Active Problems  Baseline dementia, on aricept  No recent falls since starting on aricept per family  Hospital day # 2  Rosalin Hawking, MD PhD Stroke Neurology 12/19/2015 1:52 PM    To contact Stroke Continuity provider, please refer to [email or web address] After hours, contact General Neurology

## 2015-12-19 NOTE — Evaluation (Signed)
Speech Language Pathology Evaluation Patient Details Name: SHAHEEM FOLLEY MRN: [id number] DOB: Sep 14, 1926 Today's Date: 12/19/2015 Time: [id number] SLP Time Calculation (min) (ACUTE ONLY): 13 min  Problem List:  Patient Active Problem List   Diagnosis Date Noted  . CVA (cerebral infarction) 12/17/2015   Past Medical History: History reviewed. No pertinent past medical history. Past Surgical History: History reviewed. No pertinent past surgical history. HPI:  81 y.o. male with a history of hypertension and dementia, brought to the emergency room and code stroke status following acute onset of slurred speech and right-sided weakness. CT negative for acute event; tPA administered. MRI revealed small areas of acute ischemia L frontal lobe/MCA territory.    Assessment / Plan / Recommendation Clinical Impression  Pt presents with generalized deficits in attention, memory, comprehension that appear to be related to overall lethargy rather than site of lesion of CVA. SLP will follow for improvements and necessity of post-acute f/u.      SLP Assessment  Patient needs continued Speech Lanaguage Pathology Services    Follow Up Recommendations   (tba)    Frequency and Duration min 2x/week  1 week      SLP Evaluation Prior Functioning  Cognitive/Linguistic Baseline: Baseline deficits Baseline deficit details: mild dementia Type of Home: House  Lives With: Spouse Available Help at Discharge: Family;Available 24 hours/day Vocation: Retired   Associate Professor  Overall Cognitive Status: Impaired/Different from baseline Arousal/Alertness: Lethargic Orientation Level: Oriented to person;Disoriented to place;Disoriented to time;Disoriented to situation Attention: Sustained Sustained Attention: Impaired Sustained Attention Impairment: Verbal basic;Functional basic Memory: Impaired Memory Impairment: Storage deficit Awareness: Impaired Awareness Impairment: Intellectual impairment Problem  Solving: Impaired Problem Solving Impairment: Verbal basic Safety/Judgment: Impaired    Comprehension  Auditory Comprehension Overall Auditory Comprehension: Impaired Yes/No Questions: Within Functional Limits Commands: Impaired Two Step Basic Commands: 25-49% accurate Visual Recognition/Discrimination Discrimination: Within Function Limits Reading Comprehension Reading Status: Within funtional limits    Expression Expression Primary Mode of Expression: Verbal Verbal Expression Overall Verbal Expression: Impaired Initiation: No impairment Level of Generative/Spontaneous Verbalization: Conversation Repetition: No impairment Naming: Impairment Confrontation: Impaired Convergent: 75-100% accurate Written Expression Dominant Hand: Right Written Expression: Not tested   Oral / Motor Oral Motor/Sensory Function Overall Oral Motor/Sensory Function: Within functional limits Motor Speech Overall Motor Speech: Appears within functional limits for tasks assessed    Juan Quam Laurice 12/19/2015, 3:07 PM

## 2015-12-19 NOTE — Consult Note (Signed)
Physical Medicine and Rehabilitation Consult Reason for Consult: Left frontal lobe MCA territory infarct Referring Physician: Dr.Xu   HPI: Ryan Payne is a 81 y.o. right handed male with history of hypertension, dementia. Lives with wife in [place]. Independent with a cane. He does not drive. Admitted 12/17/2015 with right-sided weakness and slurred speech. MRI of the brain showed small acute ischemic left frontal lobe MCA territory infarct as well as moderate chronic small vessel ischemic disease. MRA of the head with no occlusion or stenosis. Patient did not receive TPA. EKG showed atrial fibrillation. Troponin 0.09. Echocardiogram with ejection fraction of [id number] normal systolic function. Carotid Doppler showed no ICA stenosis. Neurology consulted presently on Eliquis for CVA prophylaxis. Dysphagia #2 thin liquid diet. Bouts of confusion with baseline dementia soft restraints for safety. Physical and occupational therapy evaluations are pending. M.D. has requested physical medicine rehabilitation consult. Discussed patient's baseline with his wife. She states that he is independent with his dressing and bathing and is continent of bowel and bladder. She indicates that his dementia has been termed mild to moderate. He was not driving.  He did not wander.  Review of Systems  Unable to perform ROS: mental acuity   History reviewed. No pertinent past medical history. History reviewed. No pertinent past surgical history. No family history on file. Social History:  reports that he has never smoked. He does not have any smokeless tobacco history on file. He reports that he does not drink alcohol. His drug history is not on file. Allergies:  Allergies  Allergen Reactions  . Morphine And Related Other (See Comments)    DISORIENTED   Medications Prior to Admission  Medication Sig Dispense Refill  . acetaminophen (TYLENOL) 500 MG tablet Take 1,000 mg by mouth every 6 (six) hours  as needed for moderate pain.    Marland Kitchen amLODipine (NORVASC) 5 MG tablet Take 5 mg by mouth at bedtime.  11  . donepezil (ARICEPT ODT) 10 MG disintegrating tablet Take 10 mg by mouth at bedtime.  5    Home: Home Living Family/patient expects to be discharged to:: Private residence Living Arrangements: Spouse/significant other  Functional History:   Functional Status:  Mobility:          ADL:    Cognition: Cognition Orientation Level: Oriented to person, Disoriented to place, Disoriented to time, Disoriented to situation    Blood pressure 118/99, pulse 75, temperature 98.6 F (37 C), temperature source Axillary, resp. rate 20, height 5\' 8"  (1.727 m), weight 90.8 kg (200 lb 2.8 oz), SpO2 96 %. Physical Exam  HENT:  Head: Normocephalic.  Eyes: EOM are normal.  Neck: Normal range of motion. Neck supple. No thyromegaly present.  Cardiovascular: An irregularly irregular rhythm present.  Cardiac rate controlled  Respiratory: Effort normal and breath sounds normal. No respiratory distress.  GI: Soft. Bowel sounds are normal. He exhibits no distension.  Neurological:  Lethargic but arousable. Bilateral mittens in place. He is able to provide his name but not place. Follows simple commands. He could provide his wife's name who was sitting at bedside. Limited awareness of deficits He does give equal effort with grip strength bilaterally as well as tricep and bicep at least 4/5 bilaterally Could not attend to lower extremity testing  Skin: Skin is warm and dry.  Unable to perform manual muscle testing due to mental status. He does withdraw to pinch in bilateral upper and lower limbs  No results found for this or  any previous visit (from the past 24 hour(s)). Ct Head Wo Contrast  12/18/2015  CLINICAL DATA:  81 year old male 24 hours status post tPA. Initial encounter. Code stroke with right side weakness on 12/17/2015. EXAM: CT HEAD WITHOUT CONTRAST TECHNIQUE: Contiguous axial images were  obtained from the base of the skull through the vertex without intravenous contrast. COMPARISON:  Head CT without contrast 12/17/2015. Brain MRI 02/21/2015. FINDINGS: Partial opacification and moderate sclerosis of the left mastoids appears bike the sequelae of chronic inflammation. Mild paranasal sinus mucosal thickening is stable. No acute osseous abnormality identified. No acute orbit or scalp soft tissue findings. Calcified atherosclerosis at the skull base. No intracranial mass effect. No ventriculomegaly. No acute or evolving cortically based infarct identified. Posterior fossa gray-white matter differentiation appears stable. No acute intracranial hemorrhage identified. Patchy bilateral white matter hypodensity appears stable. IMPRESSION: No intracranial hemorrhage or evolving infarct identified. Stable noncontrast CT appearance of the brain. Electronically Signed   By: Genevie Ann M.D.   On: 12/18/2015 14:04   Ct Head Wo Contrast  12/17/2015  CLINICAL DATA:  Code stroke. Right-sided weakness. Right facial droop and aphasia. EXAM: CT HEAD WITHOUT CONTRAST TECHNIQUE: Contiguous axial images were obtained from the base of the skull through the vertex without intravenous contrast. COMPARISON:  MRI brain 02/21/2015. FINDINGS: Moderate diffuse white matter disease and atrophy is again seen bilaterally. No acute cortical infarct, hemorrhage, or mass lesion is present. The basal ganglia are intact. Insular ribbon is normal. No acute cortical infarct is present. Mild mucosal thickening is present in the anterior right ethmoid air cells and frontal sinus. There is fluid or soft tissue within the left middle ear. IMPRESSION: 1. No acute intracranial abnormality or significant interval change. 2. Stable moderate atrophy and white matter disease. 3. Mild anterior sinus disease on the right. 4. Fluid or soft tissue in the left middle ear cavity. These results were called by telephone at the time of interpretation on  12/17/2015 at 2:05pm to Dr. Nicole Kindred, who verbally acknowledged these results. Electronically Signed   By: San Morelle M.D.   On: 12/17/2015 14:17   Mr Brain Wo Contrast  12/18/2015  CLINICAL DATA:  New onset garbled speech and, RIGHT-sided weakness. EXAM: MRI HEAD WITHOUT CONTRAST MRA HEAD WITHOUT CONTRAST TECHNIQUE: Multiplanar, multiecho pulse sequences of the brain and surrounding structures were obtained without intravenous contrast. Angiographic images of the head were obtained using MRA technique without contrast. COMPARISON:  CT head December 18, 2015 at 1345 hours and MRI head February 21, 2015 FINDINGS: MRI HEAD FINDINGS Mild motion degraded examination. Multiple subcentimeter foci of reduced diffusion LEFT posterior frontal lobe, patchy reduced diffusion within LEFT posterior insula measuring up to 13 mm. Faint low ADC value in the larger insular lesion, FLAIR T2 bright signal. The ventricles and sulci are normal for patient's age. No abnormal parenchymal signal, mass lesions, mass effect. Patchy supratentorial white matter FLAIR T2 hyperintensities are relatively unchanged. No susceptibility artifact to suggest hemorrhage. No abnormal extra-axial fluid collections. No extra-axial masses though, contrast enhanced sequences would be more sensitive. Normal major intracranial vascular flow voids seen at the skull base. Ocular globes and orbital contents are unremarkable though not tailored for evaluation. No abnormal sellar expansion. No suspicious calvarial bone marrow signal. Craniocervical junction maintained. Trace paranasal sinus mucosal thickening. Mild bilateral mastoid effusions, under pneumatized on the LEFT. Patient is edentulous. MRA HEAD FINDINGS Mildly motion degraded examination. Anterior circulation: Normal flow related enhancement of the included cervical, petrous, cavernous and supraclinoid internal  carotid arteries. Patent anterior communicating artery. Flow related enhancement of  the anterior and middle cerebral arteries, including distal segments. Mild stenosis proximal LEFT proximal M2 segment. Moderate stenosis proximal RIGHT M2 segment. No large vessel occlusion, high-grade stenosis, abnormal luminal irregularity, aneurysm. Posterior circulation: Codominant vertebral artery's. Basilar artery is patent, with normal flow related enhancement of the main branch vessels. Bulbous aneurysm. Normal flow related enhancement of the posterior cerebral arteries. No large vessel occlusion, high-grade stenosis, abnormal luminal irregularity, aneurysm. IMPRESSION: MRI HEAD: Small areas of acute ischemia LEFT frontal lobe/MCA territory. Involutional changes. Moderate chronic small vessel ischemic disease. MRA HEAD: Mild motion degraded examination. No acute large vessel occlusion or high-grade stenosis. Moderate stenosis RIGHT M2 segment, mild stenosis LEFT M2 segment. Electronically Signed   By: Elon Alas M.D.   On: 12/18/2015 23:35   Dg Chest Port 1 View  12/18/2015  CLINICAL DATA:  CVA. EXAM: PORTABLE CHEST 1 VIEW COMPARISON:  CT 06/03/2007. FINDINGS: Mediastinum hilar structures normal. Cardiomegaly. Very mild pulmonary vascular prominence interstitial prominence. Very mild changes of congestive heart failure cannot be excluded Low lung volumes with mild bibasilar atelectasis and/or infiltrates. Tiny left left pleural effusion cannot be excluded. No pneumothorax . IMPRESSION: 1. Cardiomegaly. Very mild pulmonary interstitial prominence noted. Tiny left pleural effusion cannot be excluded Very mild CHF cannot be excluded. 2. Low lung volumes with mild bibasilar atelectasis and/or infiltrates. Electronically Signed   By: Marcello Moores  Register   On: 12/18/2015 07:26   Mr Jodene Nam Head/brain Wo Cm  12/18/2015  CLINICAL DATA:  New onset garbled speech and, RIGHT-sided weakness. EXAM: MRI HEAD WITHOUT CONTRAST MRA HEAD WITHOUT CONTRAST TECHNIQUE: Multiplanar, multiecho pulse sequences of the brain  and surrounding structures were obtained without intravenous contrast. Angiographic images of the head were obtained using MRA technique without contrast. COMPARISON:  CT head December 18, 2015 at 1345 hours and MRI head February 21, 2015 FINDINGS: MRI HEAD FINDINGS Mild motion degraded examination. Multiple subcentimeter foci of reduced diffusion LEFT posterior frontal lobe, patchy reduced diffusion within LEFT posterior insula measuring up to 13 mm. Faint low ADC value in the larger insular lesion, FLAIR T2 bright signal. The ventricles and sulci are normal for patient's age. No abnormal parenchymal signal, mass lesions, mass effect. Patchy supratentorial white matter FLAIR T2 hyperintensities are relatively unchanged. No susceptibility artifact to suggest hemorrhage. No abnormal extra-axial fluid collections. No extra-axial masses though, contrast enhanced sequences would be more sensitive. Normal major intracranial vascular flow voids seen at the skull base. Ocular globes and orbital contents are unremarkable though not tailored for evaluation. No abnormal sellar expansion. No suspicious calvarial bone marrow signal. Craniocervical junction maintained. Trace paranasal sinus mucosal thickening. Mild bilateral mastoid effusions, under pneumatized on the LEFT. Patient is edentulous. MRA HEAD FINDINGS Mildly motion degraded examination. Anterior circulation: Normal flow related enhancement of the included cervical, petrous, cavernous and supraclinoid internal carotid arteries. Patent anterior communicating artery. Flow related enhancement of the anterior and middle cerebral arteries, including distal segments. Mild stenosis proximal LEFT proximal M2 segment. Moderate stenosis proximal RIGHT M2 segment. No large vessel occlusion, high-grade stenosis, abnormal luminal irregularity, aneurysm. Posterior circulation: Codominant vertebral artery's. Basilar artery is patent, with normal flow related enhancement of the main  branch vessels. Bulbous aneurysm. Normal flow related enhancement of the posterior cerebral arteries. No large vessel occlusion, high-grade stenosis, abnormal luminal irregularity, aneurysm. IMPRESSION: MRI HEAD: Small areas of acute ischemia LEFT frontal lobe/MCA territory. Involutional changes. Moderate chronic small vessel ischemic disease. MRA HEAD: Mild motion  degraded examination. No acute large vessel occlusion or high-grade stenosis. Moderate stenosis RIGHT M2 segment, mild stenosis LEFT M2 segment. Electronically Signed   By: Elon Alas M.D.   On: 12/18/2015 23:35    Assessment/Plan: Diagnosis: Acute right hemiparesis and slurred speech secondary to left posterior frontal infarct, likely embolic, right-sided weakness improving 1. Does the need for close, 24 hr/day medical supervision in concert with the patient's rehab needs make it unreasonable for this patient to be served in a less intensive setting? No 2. Co-Morbidities requiring supervision/potential complications: Hypertension, atrial fibrillation, history of dementia 3. Due to bladder management, bowel management and safety, does the patient require 24 hr/day rehab nursing? Potentially 4. Does the patient require coordinated care of a physician, rehab nurse, PT, OT to address physical and functional deficits in the context of the above medical diagnosis(es)? No Addressing deficits in the following areas: balance, endurance, locomotion, strength, transferring, bathing, dressing and toileting 5. Can the patient actively participate in an intensive therapy program of at least 3 hrs of therapy per day at least 5 days per week? Potentially 6. The potential for patient to make measurable gains while on inpatient rehab is Limited, likely close to baseline 7. Anticipated functional outcomes upon discharge from inpatient rehab are n/a  with PT, n/a with OT, n/a with SLP. 8. Estimated rehab length of stay to reach the above functional goals  is: Not applicable 9. Does the patient have adequate social supports and living environment to accommodate these discharge functional goals? Yes 10. Anticipated D/C setting: Home 11. Anticipated post D/C treatments: [place] therapy 12. Overall Rehab/Functional Prognosis: fair  RECOMMENDATIONS: This patient's condition is appropriate for continued rehabilitative care in the following setting: [facility] Therapy Patient has agreed to participate in recommended program. N/A Note that insurance prior authorization may be required for reimbursement for recommended care.  Comment: Received Haldol and Seroquel yesterday evening, somnolence should improve as these wear off. Do not think patient will require inpatient rehabilitation. Per wife she feels he is near his baseline    12/19/2015

## 2015-12-19 NOTE — Progress Notes (Signed)
Speech Language Pathology Treatment: Dysphagia  Patient Details Name: Ryan Payne MRN: [id number] DOB: [birth year] Today's Date: 12/19/2015 Time: [id number] SLP Time Calculation (min) (ACUTE ONLY): 16 min  Assessment / Plan / Recommendation Clinical Impression  F/u for dysphagia: pt remains lethargic today.  His wife reports good appetite at breakfast.  During today's session, he consumed thin liquids with min cues for rate/bolus size, but demonstrated adequate airway protection with no overt s/s of aspiration.  Answered wife's questions about diet and feeding.  Will likely be able to advance diet to regular once MS improves.    HPI HPI: 81 y.o. male with a history of hypertension and dementia, brought to the emergency room and code stroke status following acute onset of slurred speech and right-sided weakness. CT negative for acute event; tPA administered. MRI revealed small areas of acute ischemia L frontal lobe/MCA territory.       SLP Plan  Continue with current plan of care     Recommendations  Diet recommendations: Thin liquid;Dysphagia 2 (fine chop) Liquids provided via: Cup;Straw Medication Administration: Whole meds with puree Supervision: Patient able to self feed;Staff to assist with self feeding Compensations: Minimize environmental distractions;Slow rate;Small sips/bites Postural Changes and/or Swallow Maneuvers: Seated upright 90 degrees              Oral Care Recommendations: Oral care BID Follow up Recommendations:  (tba) Plan: Continue with current plan of care   Juan Quam Laurice 12/19/2015, 3:11 PM

## 2015-12-19 NOTE — Evaluation (Signed)
Occupational Therapy Evaluation Patient Details Name: Ryan Payne MRN: [id number] DOB: [birth year] Today's Date: 12/19/2015    History of Present Illness Pt is an 81 y/o male with a PMH of HTN, dementia. Pt presents to the ED s/p acute onset of slurred speech and R-sided weakness. He was found to be in a-fib after arriving in the ED. CT was negative however MRI revealed small areas of acute ischemia L frontal lobe/MCA territory.    Clinical Impression   Pt admitted with above. He demonstrates the below listed deficits and will benefit from continued OT to maximize safety and independence with BADLs.  Pt with limited eval due to lethargy.  Per PM&R note, wife reports pt is close to his baseline functioning.  Will follow acutely, and recommend HHOT.       Follow Up Recommendations  Home health OT;Supervision/Assistance - 24 hour    Equipment Recommendations       Recommendations for Other Services       Precautions / Restrictions Precautions Precautions: Fall Precaution Comments: Has been violent and agressive with staff at times. On 4-point restraints at time of PT eval however was pleasant and agreeable throughout session.  Restrictions Weight Bearing Restrictions: No      Mobility Bed Mobility Overal bed mobility: Needs Assistance Bed Mobility: Supine to Sit     Supine to sit: Min assist     General bed mobility comments: Pt was able to transition to EOB with min assist for trunk elevation to full sitting position. Increased cues to initiate transfer due to lethargy.   Transfers Overall transfer level: Needs assistance Equipment used: Rolling walker (2 wheeled) Transfers: Sit to/from Stand Sit to Stand: Min assist         General transfer comment: Assist to power-up to full standing, and gain/maintain standing balance with RW. Pt initially attempted with the [facility] and was transitioned to the RW for increased support.     Balance Overall balance assessment:  Needs assistance Sitting-balance support: Feet supported;No upper extremity supported Sitting balance-Leahy Scale: Fair     Standing balance support: Bilateral upper extremity supported;During functional activity Standing balance-Leahy Scale: Poor                              ADL Overall ADL's : Needs assistance/impaired     Grooming: Oral care;Moderate assistance;Sitting Grooming Details (indicate cue type and reason): Pt with difficulty maintaining arousal.  He is able to correctly identify toothbrush and demonstrate how to use it                                General ADL Comments: Pt unable to maintain adequate arousal to participate in ADLs except simple grooming      Vision Additional Comments: unable to assess due to lethargy    Perception Perception Comments: unable due to lethargy    Praxis      Pertinent Vitals/Pain Pain Assessment: Faces Faces Pain Scale: Hurts little more Pain Location: Lt shoulder  Pain Descriptors / Indicators: Grimacing Pain Intervention(s): Monitored during session     Hand Dominance Right   Extremity/Trunk Assessment Upper Extremity Assessment Upper Extremity Assessment: Generalized weakness   Lower Extremity Assessment Lower Extremity Assessment: Defer to PT evaluation   Cervical / Trunk Assessment Cervical / Trunk Assessment: Kyphotic   Communication Communication Communication: Other (comment) (difficult to understand )   Cognition  Arousal/Alertness: Lethargic Behavior During Therapy: Flat affect Overall Cognitive Status: Difficult to assess                     General Comments       Exercises       Shoulder Instructions      Home Living Family/patient expects to be discharged to:: Private residence Living Arrangements: Spouse/significant other Available Help at Discharge: Family;Available 24 hours/day Type of Home: House Home Access: Stairs to enter [employer] of  Steps: 2   Home Layout: One level     Bathroom Shower/Tub: Teacher, early years/pre: Handicapped height Bathroom Accessibility: Yes   Home Equipment: Environmental consultant - 2 wheels;Cane - single point;Shower seat          Prior Functioning/Environment Level of Independence: Independent with assistive device(s)        Comments: Wife reports that pt has been independent with ADL's. Started having falls at home and wife got the pt a cane. She reports he has not had any falls since using the cane.     OT Diagnosis: Generalized weakness;Cognitive deficits   OT Problem List: Decreased strength;Decreased activity tolerance;Impaired balance (sitting and/or standing);Decreased cognition;Decreased safety awareness   OT Treatment/Interventions: Self-care/ADL training;DME and/or AE instruction;Therapeutic activities;Patient/family education;Balance training;Cognitive remediation/compensation    OT Goals(Current goals can be found in the care plan section) Acute Rehab OT Goals Patient Stated Goal: Pt did not state goals OT Goal Formulation: Patient unable to participate in goal setting Time For Goal Achievement: 01/02/16 Potential to Achieve Goals: Fair ADL Goals Pt Will Perform Grooming: with min guard assist;standing Pt Will Perform Upper Body Bathing: with set-up;sitting;with supervision Pt Will Perform Lower Body Bathing: with min guard assist;sit to/from stand Pt Will Perform Upper Body Dressing: with set-up;sitting;with supervision Pt Will Perform Lower Body Dressing: with min guard assist;sit to/from stand Pt Will Transfer to Toilet: with min guard assist;ambulating;regular height toilet;bedside commode;grab bars Pt Will Perform Toileting - Clothing Manipulation and hygiene: with min guard assist;sit to/from stand  OT Frequency: Min 2X/week   Barriers to D/C:            Co-evaluation              End of Session    Activity Tolerance: Patient limited by  lethargy Patient left: in chair;with call bell/phone within reach;with restraints reapplied;with chair alarm set   Time: [id number] OT Time Calculation (min): 12 min Charges:  OT General Charges $OT Visit: 1 Procedure OT Evaluation $Initial OT Evaluation Tier I: 1 Procedure G-Codes:    Armoni Kludt M 2015/12/31, 1:18 PM

## 2015-12-19 NOTE — Evaluation (Signed)
Physical Therapy Evaluation Patient Details Name: Ryan Payne MRN: [id number] DOB: [birth year] Today's Date: 12/19/2015   History of Present Illness  Pt is an 81 y/o male with a PMH of HTN, dementia. Pt presents to the ED s/p acute onset of slurred speech and R-sided weakness. He was found to be in a-fib after arriving in the ED. CT was negative however MRI revealed small areas of acute ischemia L frontal lobe/MCA territory.   Clinical Impression  Pt admitted with above diagnosis. Pt currently with functional limitations due to the deficits listed below (see PT Problem List). At the time of PT eval pt was able to perform transfers and ambulation with min assist for balance and support. Per wife, pt usually uses a cane, however was unable to maintain balance without bilateral UE support this session. Pt will benefit from skilled PT to increase their independence and safety with mobility to allow discharge to the venue listed below.       Follow Up Recommendations CIR;Supervision/Assistance - 24 hour    Equipment Recommendations  None recommended by PT    Recommendations for Other Services Rehab consult     Precautions / Restrictions Precautions Precautions: Fall Precaution Comments: Has been violent and agressive with staff at times. On 4-point restraints at time of PT eval however was pleasant and agreeable throughout session.  Restrictions Weight Bearing Restrictions: No      Mobility  Bed Mobility Overal bed mobility: Needs Assistance Bed Mobility: Supine to Sit     Supine to sit: Min assist     General bed mobility comments: Pt was able to transition to EOB with min assist for trunk elevation to full sitting position. Increased cues to initiate transfer due to lethargy.   Transfers Overall transfer level: Needs assistance Equipment used: Rolling walker (2 wheeled) Transfers: Sit to/from Stand Sit to Stand: Min assist         General transfer comment: Assist to  power-up to full standing, and gain/maintain standing balance with RW. Pt initially attempted with the [place] and was transitioned to the RW for increased support.   Ambulation/Gait Ambulation/Gait assistance: Min assist Ambulation Distance (Feet): 225 Feet Assistive device: Rolling walker (2 wheeled) Gait Pattern/deviations: Step-through pattern;Decreased stride length;Trendelenburg;Trunk flexed Gait velocity: Decreased Gait velocity interpretation: Below normal speed for age/gender General Gait Details: Trendelenberg vs. quad weakness with weightbearing. Pt required occasional min assist for support and frequent assist for walker management with turns.   Stairs            Wheelchair Mobility    Modified Rankin (Stroke Patients Only) Modified Rankin (Stroke Patients Only) Pre-Morbid Rankin Score: No significant disability Modified Rankin: Moderately severe disability     Balance Overall balance assessment: Needs assistance Sitting-balance support: Feet supported;No upper extremity supported Sitting balance-Leahy Scale: Fair     Standing balance support: Bilateral upper extremity supported;During functional activity Standing balance-Leahy Scale: Poor                               Pertinent Vitals/Pain Pain Assessment: Faces Faces Pain Scale: Hurts little more Pain Location: L hip - pt states he fell PTA Pain Descriptors / Indicators: Aching    Home Living Family/patient expects to be discharged to:: Private residence Living Arrangements: Spouse/significant other Available Help at Discharge: Family;Available 24 hours/day Type of Home: House Home Access: Stairs to enter   [employer] of Steps: 2 Home Layout: One level Home Equipment:  Walker - 2 wheels;Cane - single point;Shower seat      Prior Function Level of Independence: Independent with assistive device(s)         Comments: Wife reports that pt has been independent with ADL's.  Started having falls at home and wife got the pt a cane. She reports he has not had any falls since using the cane.      Hand Dominance        Extremity/Trunk Assessment   Upper Extremity Assessment: Defer to OT evaluation           Lower Extremity Assessment: Generalized weakness      Cervical / Trunk Assessment: Kyphotic  Communication   Communication: Other (comment) (Difficult to understand at times)  Cognition Arousal/Alertness: Lethargic Behavior During Therapy: WFL for tasks assessed/performed Overall Cognitive Status: History of cognitive impairments - at baseline (Baseline dementia)                      General Comments      Exercises        Assessment/Plan    PT Assessment Patient needs continued PT services  PT Diagnosis Difficulty walking   PT Problem List Decreased strength;Decreased range of motion;Decreased activity tolerance;Decreased balance;Decreased mobility;Decreased knowledge of use of DME;Decreased safety awareness;Decreased knowledge of precautions;Decreased cognition  PT Treatment Interventions DME instruction;Gait training;Stair training;Functional mobility training;Therapeutic activities;Therapeutic exercise;Neuromuscular re-education;Patient/family education   PT Goals (Current goals can be found in the Care Plan section) Acute Rehab PT Goals Patient Stated Goal: Pt did not state goals PT Goal Formulation: With patient/family Time For Goal Achievement: 01/02/16 Potential to Achieve Goals: Good    Frequency Min 4X/week   Barriers to discharge        Co-evaluation               End of Session Equipment Utilized During Treatment: Gait belt Activity Tolerance: Patient limited by fatigue;Patient limited by lethargy Patient left: in chair;with call bell/phone within reach;with chair alarm set;with family/visitor present;with restraints reapplied Nurse Communication: Mobility status         Time: [id number] PT Time  Calculation (min) (ACUTE ONLY): 35 min   Charges:   PT Evaluation $Initial PT Evaluation Tier I: 1 Procedure PT Treatments $Gait Training: 8-22 mins   PT G Codes:        Rolinda Roan 01/17/2016, 12:27 PM   Rolinda Roan, PT, DPT Acute Rehabilitation Services Pager: [phone]

## 2015-12-20 NOTE — Progress Notes (Signed)
STROKE TEAM PROGRESS NOTE   SUBJECTIVE (INTERVAL HISTORY) Wife at bedside. He wants to go home. She wants him to stay for a few more days. Rehab says [facility]. Will have PT reassess. Wife also concerned about cost of medications. She wants his monitor removed. Patient more alert and oriented this am.   OBJECTIVE Temp:  [97.7 F (36.5 C)-98.6 F (37 C)] 98.6 F (37 C) (12/29 0947) Pulse Rate:  [74-90] 74 (12/29 0947) Cardiac Rhythm:  [-] Atrial fibrillation (12/29 0700) Resp:  [18-20] 18 (12/29 0947) BP: (135-170)/(65-86) 135/67 mmHg (12/29 0947) SpO2:  [90 %-96 %] 96 % (12/29 0947)  CBC:   Recent Labs Lab 12/17/15 1350 12/17/15 1356  WBC 5.4  --   NEUTROABS 2.8  --   HGB 13.5 15.0  HCT 39.7 44.0  MCV 91.5  --   PLT 167  --     Basic Metabolic Panel:   Recent Labs Lab 12/17/15 1350 12/17/15 1356  NA 139 140  K 3.8 3.7  CL 105 101  CO2 25  --   GLUCOSE 110* 106*  BUN 17 20  CREATININE 1.35* 1.30*  CALCIUM 9.2  --     Lipid Panel:     Component Value Date/Time   CHOL 183 12/18/2015 0210   TRIG 50 12/18/2015 0210   HDL 63 12/18/2015 0210   CHOLHDL 2.9 12/18/2015 0210   VLDL 10 12/18/2015 0210   LDLCALC 110* 12/18/2015 0210   HgbA1c:  Lab Results  Component Value Date   HGBA1C 5.9* 12/18/2015   Urine Drug Screen:     Component Value Date/Time   LABOPIA NONE DETECTED 12/17/2015 1459   COCAINSCRNUR NONE DETECTED 12/17/2015 1459   LABBENZ NONE DETECTED 12/17/2015 1459   AMPHETMU NONE DETECTED 12/17/2015 1459   THCU NONE DETECTED 12/17/2015 1459   LABBARB NONE DETECTED 12/17/2015 1459      IMAGING I have personally reviewed the radiological images below and agree with the radiology interpretations.  Ct Head Wo Contrast 12/17/2015   1. No acute intracranial abnormality or significant interval change. 2. Stable moderate atrophy and white matter disease. 3. Mild anterior sinus disease on the right. 4. Fluid or soft tissue in the left middle ear cavity. 7   12/18/2015   No intracranial hemorrhage or evolving infarct identified. Stable noncontrast CT appearance of the brain.   MRI HEAD 12/18/2015   Small areas of acute ischemia LEFT frontal lobe/MCA territory. Involutional changes. Moderate chronic small vessel ischemic disease.   MRA HEAD 12/18/2015   Mild motion degraded examination. No acute large vessel occlusion or high-grade stenosis. Moderate stenosis RIGHT M2 segment, mild stenosis LEFT M2 segment.    2D echo  - Left ventricle: The cavity size was normal. Systolic function wasnormal. The estimated ejection fraction was in the range of 55%to 60%. - Impressions:   Only 2 parasternal images obtained as patient combative; LVfunction appears to be normal on limited views; no pericardialeffusion; no doppler performed; suggest repeat study when patientmore cooperative.  CUS  Study was technically difficult due to poor patient cooperation. Findings suggest 1-39% internal carotid artery stenosis bilaterally. The right vertebral artery is patent with antegrade flow. Unable to visualize the left vertebral artery.  EKG - afib   PHYSICAL EXAM General - Well nourished, well developed, calm without restraints.  Ophthalmologic - Fundi not visualized due to noncooperation.  Cardiovascular - irregularly irregular heart rate and rhythm.  Mental Status -  Awake alert, orientated to place and people but not to  time. Language fluent output, follows simple commands, not cooperative on naming or repetition, moderate to severe dysarthria. Fund of knowledge impaired.  Cranial Nerves II - XII - II - blinking to visual threat bilaterally. III, IV, VI - Extraocular movements intact. V - not cooperative. VII - Facial movement intact bilaterally. VIII - Hearing & vestibular intact bilaterally. X - Palate elevates symmetrically, moderate to severe dysarthria. XI - not cooperative. XII - not cooperative.  Motor Strength - The patient moving all  extremities symmetrically, but not cooperative on commands.  Bulk was normal and fasciculations were absent.   Motor Tone - Muscle tone was assessed at the neck and appendages and was normal.  Reflexes - The patient's reflexes were 1+ in all extremities and he had no pathological reflexes.  Sensory - symmetrical bilaterally as per pt.    Coordination - not cooperative.  Tremor was absent.  Gait and Station - not tested, deferred to PT due to safety concerns.   ASSESSMENT/PLAN [name] is a 81 y.o. male with history of hypertension and dementia on aricept presenting with slurred speech and right-sided weakness. He received IV t-PA 12/17/2015 at 1426.   Stroke:  left frontal lobe/MCA territory infarcts, embolic secondary to new onset atrial fibrillation   Resultant  Delirium improved  MRI  L frontal lobe/MCA territory infarcts  MRA  Stenosis bilateral M2s   Carotid Doppler  No significant stenosis   2D Echo  EF 55-60%  LDL 110  HgbA1c 5.9  SCDs for VTE prophylaxis DIET DYS 2 Room service appropriate?: Yes; Fluid consistency:: Thin  No antithrombotic prior to admission, started on aspirin 325 mg daily post tPA. Given atrial fibrillation, will change to NOAC. See below.  Ongoing aggressive stroke risk factor management  Therapy recommendations:  CIR initially recommended by therapies. Rehab MD recommends HH. Wife wants pt to be seen by PT again.   Disposition:  Pending anticipate d/c tomorrow.  Atrial Fibrillation, new onset  Home anticoagulation:  none  CHA2DS2-VASc Score = 5, ?2 oral anticoagulation recommended  Age in Years:  ?2   +2    Sex:  Male   0  Hypertension History:  yes   +1     Diabetes Mellitus:  0   Congestive Heart Failure History:  0  Vascular Disease History:  0     Stroke/TIA/Thromboembolism History:  yes   +2  Given small size of stroke, change aspirin to anticoagulation, eliquis 5 mg bid. Will get 30d free and follow up with PCP for  prior authorization.  Had outpt cardiology follow up  Delirium   Resumed aricept   Added seroquel, increase to 50mg  Qhs  Soft restraint PRN  improved  Dementia with behavioral disturbance  Baseline dementia  Resumed home aricept   More prone to have hospital delirium  Stable on new seroquel  50 mg qhs.    Hypertension  Stable  Hyperlipidemia  Home meds:  No statin  LDL 110, goal < 70  Add lipitor 20mg  daily  Continue statin at discharge  Other Stroke Risk Factors  Advanced age  Obesity, Body mass index is 30.44 kg/(m^2).   Family hx stroke (2 sisters and mother)  Other Active Problems  Baseline dementia, on aricept  No recent falls since starting on aricept per family  Hospital day # 3  Rosalin Hawking, MD PhD Stroke Neurology 12/20/2015 5:07 PM    To contact Stroke Continuity provider, please refer to [email or web address] After hours, contact General Neurology

## 2015-12-20 NOTE — Progress Notes (Signed)
Yesterday, order placed for benefits check for patient to be discharged home on Eliquis 90m BID. Benefits check back with patient being in a coverage gap and it costing $431.91 a month. Patients wife states they should be out of the gap on January 1st. CM was on the phone with UAngel Firefor 45 min and was not able to get an answer on cost after Jan 1st other than patient does not have Part D. CM went and spoke with the patients wife and she says they [facility]. CM called and spoke to the [facility] and they were not able to give me the cost of the Eliquis after Jan 1st either. They stated the medication would need a prior authorization. MD made aware. This am CM spoke with Dr XErlinda Hongand SIvin Bootyand informed them of the issues CM is having with seeing if Eliquis is affordable for Mr [place] They asked that patient receive the 30 day free card and follow up with his PCP for prior authorization of the medication and pricing. CM met with the patients wife, gave her the 30 day free card and went over how the 30 day free card works, and to [facility] Mr Smith[facility] the first week of discharge to obtain authorization for the med and to obtain pricing. CM informed her that if it was too expensive she could call his PCP or the Neurology office to have med adjusted or changed.

## 2015-12-20 NOTE — Progress Notes (Signed)
Physical Therapy Treatment Patient Details Name: Ryan Payne MRN: [id number] DOB: [birth year] Today's Date: 12/20/2015    History of Present Illness Pt is an 81 y/o male with a PMH of HTN, dementia. Pt presents to the ED s/p acute onset of slurred speech and R-sided weakness. He was found to be in a-fib after arriving in the ED. CT was negative however MRI revealed small areas of acute ischemia L frontal lobe/MCA territory.     PT Comments    Pt progressing towards physical therapy goals. Was agreeable to therapy initially however became agitated in the hall during gait training and pt unsafely returned to room. Pt was not agreeable to any further therapy at this time. RN notified that pt pulling at IV lines, and that wife was present in room when PT exited. Will continue to follow.   Follow Up Recommendations  Home health PT;Supervision/Assistance - 24 hour     Equipment Recommendations  None recommended by PT    Recommendations for Other Services       Precautions / Restrictions Precautions Precautions: Fall Precaution Comments: Has been violent and agressive with staff at times.  Restrictions Weight Bearing Restrictions: No    Mobility  Bed Mobility               General bed mobility comments: Pt sitting up in the recliner upon PT arrival.   Transfers Overall transfer level: Needs assistance Equipment used: Rolling walker (2 wheeled) Transfers: Sit to/from Stand Sit to Stand: Min assist         General transfer comment: Steadying assist as pt powered-up to full standing position. VC's for hand placement on seated surface for safety.   Ambulation/Gait Ambulation/Gait assistance: Min assist Ambulation Distance (Feet): 150 Feet Assistive device: Rolling walker (2 wheeled) Gait Pattern/deviations: Step-through pattern;Decreased stride length;Trunk flexed Gait velocity: Decreased Gait velocity interpretation: Below normal speed for age/gender General Gait  Details: Occasional min assist for walker management. Pt became agitated during gait training and states he is going to miss the doctor if he is gone too long.    Stairs            Wheelchair Mobility    Modified Rankin (Stroke Patients Only) Modified Rankin (Stroke Patients Only) Pre-Morbid Rankin Score: No significant disability Modified Rankin: Moderately severe disability     Balance Overall balance assessment: Needs assistance Sitting-balance support: Feet supported;No upper extremity supported Sitting balance-Leahy Scale: Fair     Standing balance support: Bilateral upper extremity supported;During functional activity Standing balance-Leahy Scale: Poor                      Cognition Arousal/Alertness: Lethargic Behavior During Therapy: Flat affect Overall Cognitive Status: History of cognitive impairments - at baseline                      Exercises      General Comments        Pertinent Vitals/Pain Pain Assessment: No/denies pain    Home Living                      Prior Function            PT Goals (current goals can now be found in the care plan section) Acute Rehab PT Goals Patient Stated Goal: Pt did not state goals PT Goal Formulation: With patient/family Time For Goal Achievement: 01/02/16 Potential to Achieve Goals: Good Progress towards PT goals: Progressing  toward goals    Frequency  Min 4X/week    PT Plan Discharge plan needs to be updated    Co-evaluation             End of Session Equipment Utilized During Treatment: Gait belt Activity Tolerance: Other (comment) (Limited by agitation) Patient left: in chair;with call bell/phone within reach;with chair alarm set;with family/visitor present     Time: 1324-1340 PT Time Calculation (min) (ACUTE ONLY): 16 min  Charges:  $Gait Training: 8-22 mins                    G Codes:      Rolinda Roan 01-12-2016, 2:30 PM  Rolinda Roan, PT,  DPT Acute Rehabilitation Services Pager: [phone]

## 2015-12-20 NOTE — Progress Notes (Signed)
Rehab admissions - Please see rehab consult recommending [facility] for follow up.  OT now recommending [facility] as well.  Wife reports patient is close to baseline.  Call me for questions.  [phone]

## 2015-12-20 NOTE — Care Management Important Message (Signed)
Important Message  Patient Details  Name: DARRENCE SIEGMAN MRN: [id number] Date of Birth: 10/08/1926   Medicare Important Message Given:  Yes    Barb Merino Batsheva Stevick 12/20/2015, 12:33 PM

## 2015-12-20 NOTE — Care Management Note (Signed)
Case Management Note  Patient Details  Name: RAYLEE ADAMEC MRN: [id number] Date of Birth: Dec 05, 1926  Subjective/Objective:                    Action/Plan: Plan is for discharge tomorrow with home health services. CM met with the patient and his wife and provided them a list of home health agencies in the [facility] area. Mrs Thelen chose [facility]. CM spoke with [facility] and faxed them the information requested. CM will continue to follow for discharge needs.   Expected Discharge Date:                  Expected Discharge Plan:  [place]  In-House Referral:  Clinical Social Work  Discharge planning Services  CM Consult  Post Acute Care Choice:  Home Health Choice offered to:  Spouse  DME Arranged:    DME Agency:     HH Arranged:  PT, OT HH Agency:  [facility]  Status of Service:  In process, will continue to follow  Medicare Important Message Given:  Yes Date Medicare IM Given:    Medicare IM give by:    Date Additional Medicare IM Given:    Additional Medicare Important Message give by:     If discussed at [place] of Stay Meetings, dates discussed:    Additional Comments:  Pollie Friar, RN 12/20/2015, 2:36 PM

## 2015-12-20 NOTE — Care Management Note (Signed)
Case Management Note  Patient Details  Name: OREE TOMAINO MRN: [id number] Date of Birth: 09/14/1926  Subjective/Objective:                    Action/Plan: CM received a call from [facility] and they are not able to accept the patient at this time. CM spoke with patients wife again and she selected [facility]. CM spoke with Dian Situ from [facility] and he accepted the referral. CM will continue to follow for further discharge needs.   Expected Discharge Date:                  Expected Discharge Plan:  [place]  In-House Referral:  Clinical Social Work  Discharge planning Services  CM Consult  Post Acute Care Choice:  Home Health Choice offered to:  Spouse  DME Arranged:    DME Agency:     HH Arranged:  PT, OT HH Agency:  [facility]  Status of Service:  In process, will continue to follow  Medicare Important Message Given:  Yes Date Medicare IM Given:    Medicare IM give by:    Date Additional Medicare IM Given:    Additional Medicare Important Message give by:     If discussed at [place] of Stay Meetings, dates discussed:    Additional Comments:  Pollie Friar, RN 12/20/2015, 3:38 PM

## 2015-12-20 NOTE — Clinical Social Work Note (Signed)
CSW received referral for SNF.  Case discussed with case manager and plan is to discharge home with home health.  CSW to sign off please re-consult if social work needs arise.  Makenzi Bannister R. Bodin Gorka, MSW, LCSWA [phone]  

## 2015-12-21 ENCOUNTER — Encounter (HOSPITAL_COMMUNITY): Payer: Self-pay | Admitting: Nurse Practitioner

## 2015-12-21 DIAGNOSIS — I10 Essential (primary) hypertension: Secondary | ICD-10-CM | POA: Diagnosis present

## 2015-12-21 DIAGNOSIS — F03918 Unspecified dementia, unspecified severity, with other behavioral disturbance: Secondary | ICD-10-CM | POA: Diagnosis present

## 2015-12-21 DIAGNOSIS — Z823 Family history of stroke: Secondary | ICD-10-CM

## 2015-12-21 DIAGNOSIS — R410 Disorientation, unspecified: Secondary | ICD-10-CM | POA: Diagnosis present

## 2015-12-21 DIAGNOSIS — E669 Obesity, unspecified: Secondary | ICD-10-CM

## 2015-12-21 DIAGNOSIS — F0391 Unspecified dementia with behavioral disturbance: Secondary | ICD-10-CM | POA: Diagnosis present

## 2015-12-21 DIAGNOSIS — I482 Chronic atrial fibrillation: Secondary | ICD-10-CM

## 2015-12-21 DIAGNOSIS — I4891 Unspecified atrial fibrillation: Secondary | ICD-10-CM | POA: Diagnosis present

## 2015-12-21 DIAGNOSIS — E785 Hyperlipidemia, unspecified: Secondary | ICD-10-CM | POA: Diagnosis present

## 2015-12-21 MED ORDER — APIXABAN 5 MG PO TABS: 5.0000 mg | ORAL_TABLET | Freq: Two times a day (BID) | ORAL | Status: AC

## 2015-12-21 MED ORDER — ATORVASTATIN CALCIUM 20 MG PO TABS: 20.0000 mg | ORAL_TABLET | Freq: Every day | ORAL | Status: AC

## 2015-12-21 MED ORDER — QUETIAPINE FUMARATE 50 MG PO TABS: 50.0000 mg | ORAL_TABLET | Freq: Every day | ORAL | Status: AC

## 2015-12-21 NOTE — Discharge Summary (Signed)
Stroke Discharge Summary  Patient ID: Ryan Payne   MRN: [id number]      DOB: 1926/04/25  Date of Admission: 12/17/2015 Date of Discharge: 12/21/2015  Attending Physician:  Rosalin Hawking, MD, Stroke MD  Consulting Physician(s):      Alysia Penna, MD (Physical Medicine & Rehabtilitation)  Patient's PCP:  No primary care provider on file.  DISCHARGE DIAGNOSIS:  Principal Problem:   CVA (cerebral infarction) - left frontal lobe/MCA territory infarcts, embolic secondary to new onset atrial fibrillation Active Problems:   Atrial fibrillation (HCC)   Acute delirium   Dementia with behavioral disturbance   Essential hypertension   Hyperlipidemia LDL goal <70   Obesity   Family history of stroke  BMI: Body mass index is 30.44 kg/(m^2).  past medical history.: hypertension and dementia History reviewed. No pertinent past surgical history.    Medication List    TAKE these medications        acetaminophen 500 MG tablet  Commonly known as:  TYLENOL  Take 1,000 mg by mouth every 6 (six) hours as needed for moderate pain.     amLODipine 5 MG tablet  Commonly known as:  NORVASC  Take 5 mg by mouth at bedtime.     apixaban 5 MG Tabs tablet  Commonly known as:  ELIQUIS  Take 1 tablet (5 mg total) by mouth 2 (two) times daily.     atorvastatin 20 MG tablet  Commonly known as:  LIPITOR  Take 1 tablet (20 mg total) by mouth daily at 6 PM.     donepezil 10 MG disintegrating tablet  Commonly known as:  ARICEPT ODT  Take 10 mg by mouth at bedtime.     QUEtiapine 50 MG tablet  Commonly known as:  SEROQUEL  Take 1 tablet (50 mg total) by mouth at bedtime.        LABORATORY STUDIES CBC    Component Value Date/Time   WBC 5.4 12/17/2015 1350   WBC 7.0 03/30/2013 1353   RBC 4.34 12/17/2015 1350   RBC 4.40 03/30/2013 1353   HGB 15.0 12/17/2015 1356   HGB 13.8 03/30/2013 1353   HCT 44.0 12/17/2015 1356   HCT 41.0 03/30/2013 1353   PLT 167 12/17/2015 1350   PLT 176  03/30/2013 1353   MCV 91.5 12/17/2015 1350   MCV 93 03/30/2013 1353   MCH 31.1 12/17/2015 1350   MCH 31.3 03/30/2013 1353   MCHC 34.0 12/17/2015 1350   MCHC 33.7 03/30/2013 1353   RDW 13.4 12/17/2015 1350   RDW 13.3 03/30/2013 1353   LYMPHSABS 1.9 12/17/2015 1350   LYMPHSABS 1.8 03/30/2013 1353   MONOABS 0.4 12/17/2015 1350   MONOABS 0.7 03/30/2013 1353   EOSABS 0.3 12/17/2015 1350   EOSABS 0.4 03/30/2013 1353   BASOSABS 0.0 12/17/2015 1350   BASOSABS 0.0 03/30/2013 1353   CMP    Component Value Date/Time   NA 140 12/17/2015 1356   NA 141 03/30/2013 1353   K 3.7 12/17/2015 1356   K 3.9 03/30/2013 1353   CL 101 12/17/2015 1356   CL 107 03/30/2013 1353   CO2 25 12/17/2015 1350   CO2 28 03/30/2013 1353   GLUCOSE 106* 12/17/2015 1356   GLUCOSE 113* 03/30/2013 1353   BUN 20 12/17/2015 1356   BUN 25* 03/30/2013 1353   CREATININE 1.30* 12/17/2015 1356   CREATININE 1.29 03/30/2013 1353   CALCIUM 9.2 12/17/2015 1350   CALCIUM 8.8 03/30/2013 1353   PROT 6.2*  12/17/2015 1350   ALBUMIN 3.7 12/17/2015 1350   AST 23 12/17/2015 1350   ALT 17 12/17/2015 1350   ALKPHOS 49 12/17/2015 1350   BILITOT 0.7 12/17/2015 1350   GFRNONAA 46* 12/17/2015 1350   GFRNONAA 51* 03/30/2013 1353   GFRAA 53* 12/17/2015 1350   GFRAA 59* 03/30/2013 1353   COAGS Lab Results  Component Value Date   INR 1.13 12/17/2015   Lipid Panel    Component Value Date/Time   CHOL 183 12/18/2015 0210   TRIG 50 12/18/2015 0210   HDL 63 12/18/2015 0210   CHOLHDL 2.9 12/18/2015 0210   VLDL 10 12/18/2015 0210   LDLCALC 110* 12/18/2015 0210   HgbA1C  Lab Results  Component Value Date   HGBA1C 5.9* 12/18/2015   Cardiac Panel (last 3 results) No results for input(s): CKTOTAL, CKMB, TROPONINI, RELINDX in the last 72 hours. Urinalysis    Component Value Date/Time   COLORURINE YELLOW 12/17/2015 1459   APPEARANCEUR CLOUDY* 12/17/2015 1459   LABSPEC 1.012 12/17/2015 1459   PHURINE 6.0 12/17/2015 1459    GLUCOSEU NEGATIVE 12/17/2015 1459   HGBUR TRACE* 12/17/2015 1459   BILIRUBINUR NEGATIVE 12/17/2015 1459   KETONESUR 15* 12/17/2015 1459   PROTEINUR NEGATIVE 12/17/2015 1459   NITRITE NEGATIVE 12/17/2015 1459   LEUKOCYTESUR NEGATIVE 12/17/2015 1459   Urine Drug Screen     Component Value Date/Time   LABOPIA NONE DETECTED 12/17/2015 1459   COCAINSCRNUR NONE DETECTED 12/17/2015 1459   LABBENZ NONE DETECTED 12/17/2015 1459   AMPHETMU NONE DETECTED 12/17/2015 1459   THCU NONE DETECTED 12/17/2015 1459   LABBARB NONE DETECTED 12/17/2015 1459    Alcohol Level    Component Value Date/Time   ETH <5 12/17/2015 1350     SIGNIFICANT DIAGNOSTIC STUDIES Ct Head Wo Contrast 12/17/2015 1. No acute intracranial abnormality or significant interval change. 2. Stable moderate atrophy and white matter disease. 3. Mild anterior sinus disease on the right. 4. Fluid or soft tissue in the left middle ear cavity. 7  12/18/2015 No intracranial hemorrhage or evolving infarct identified. Stable noncontrast CT appearance of the brain.   MRI HEAD 12/18/2015 Small areas of acute ischemia LEFT frontal lobe/MCA territory. Involutional changes. Moderate chronic small vessel ischemic disease.   MRA HEAD 12/18/2015 Mild motion degraded examination. No acute large vessel occlusion or high-grade stenosis. Moderate stenosis RIGHT M2 segment, mild stenosis LEFT M2 segment.   2D echo  - Left ventricle: The cavity size was normal. Systolic function wasnormal. The estimated ejection fraction was in the range of 55%to 60%. - Impressions: Only 2 parasternal images obtained as patient combative; LVfunction appears to be normal on limited views; no pericardialeffusion; no doppler performed; suggest repeat study when patientmore cooperative.  CUS  Study was technically difficult due to poor patient cooperation. Findings suggest 1-39% internal carotid artery stenosis bilaterally. The right vertebral artery  is patent with antegrade flow. Unable to visualize the left vertebral artery.  EKG - afib     HISTORY OF PRESENT ILLNESS Ryan Payne is an 81 y.o. male with a history of hypertension and dementia, brought to the emergency room and code stroke status following acute onset of slurred speech and right-sided weakness. Patient was last known well at noon today 12/17/2015 ([facility]). No previous history of stroke nor TIA. He was found to be in atrial fibrillation after arriving in the ED. He has not been on antiplatelet therapy no anticoagulation. CT scan of his head showed no acute intracranial abnormality. Patient was deemed  a candidate for intravenous thrombolytic therapy with TPA, which was administered. Patient was subsequently admitted to neuro intensive care unit for further management.    HOSPITAL COURSE Ryan Payne is a 81 y.o. male with history of hypertension and dementia on aricept presenting with slurred speech and right-sided weakness. He received IV t-PA 12/17/2015 at 1426. He stayed in the ICU post tPA, then transferred to the floor. He had a lot of confusion and agitated behavior that improved on seroquel. He progressed to need only Dent therapy. His wife will care for at home.  Stroke: left frontal lobe/MCA territory infarcts, embolic secondary to new onset atrial fibrillation   Resultant Delirium improved  MRI L frontal lobe/MCA territory infarcts  MRA Stenosis bilateral M2s   Carotid Doppler No significant stenosis   2D Echo EF 55-60%  LDL 110  HgbA1c 5.9  No antithrombotic prior to admission, started on aspirin 325 mg daily post tPA. Given atrial fibrillation, changed to Eliquis after discussion with wife. Case Manager gave card for 30 d free. Wife to f/u with cardiology/PCP to assist with prior approval as it was unable to be obtained in hospital. Currently in the doughnut hole.  Ongoing aggressive stroke risk factor management  Therapy recommendations: CIR  initially recommended by therapies. Rehab MD recommends HH. PT followed up the next day and agreed [facility] best for pt.  Disposition: home with wife, HH PT and OT arranged  Atrial Fibrillation, new onset  Home anticoagulation: none  CHA2DS2-VASc Score = 5, ?2 oral anticoagulation recommended Age in Years: ?50 +2  Sex: Male 2 Hypertension History: yes +1   Diabetes Mellitus: 0  Congestive Heart Failure History: 0 Vascular Disease History: 0  Stroke/TIA/Thromboembolism History: yes +2  Given small size of stroke, changed spirin to anticoagulation with  Eliquis 5 mg bid. Will get 30d free and follow up with PCP for prior authorization.  Has a cardiologist per wife. Recommended follow up.  Delirium   Resumed aricept   Added seroquel, then increased to 50mg  Qhs  Improved  Dementia with behavioral disturbance  Baseline dementia  Resumed home aricept   Stable on new seroquel 50 mg qhs.  Hypertension  Stable  Hyperlipidemia  Home meds: No statin  LDL 110, goal < 70  Added lipitor 20mg  daily  Continue statin at discharge  Other Stroke Risk Factors  Advanced age  Obesity, Body mass index is 30.44 kg/(m^2).   Family hx stroke (2 sisters and mother)  Other Active Problems  No recent falls since starting on aricept per family   DISCHARGE EXAM Blood pressure 140/94, pulse 68, temperature 98 F (36.7 C), temperature source Oral, resp. rate 20, height 5\' 8"  (1.727 m), weight 90.8 kg (200 lb 2.8 oz), SpO2 95 %. General - Well nourished, well developed, calm without restraints.  Ophthalmologic - Fundi not visualized due to noncooperation.  Cardiovascular - irregularly irregular heart rate and rhythm.  Mental Status -  Awake alert, orientated to place and people but not to time. Language fluent output, follows simple  commands, not cooperative on naming or repetition, moderate to severe dysarthria. Fund of knowledge impaired.  Cranial Nerves II - XII - II - blinking to visual threat bilaterally. III, IV, VI - Extraocular movements intact. V - not cooperative. VII - Facial movement intact bilaterally. VIII - Hearing & vestibular intact bilaterally. X - Palate elevates symmetrically, moderate to severe dysarthria. XI - not cooperative. XII - not cooperative.  Motor Strength - The patient moving all  extremities symmetrically, but not cooperative on commands. Bulk was normal and fasciculations were absent.  Motor Tone - Muscle tone was assessed at the neck and appendages and was normal.  Reflexes - The patient's reflexes were 1+ in all extremities and he had no pathological reflexes.  Sensory - symmetrical bilaterally as per pt.   Coordination - not cooperative. Tremor was absent.  Gait and Station - not tested, deferred to PT due to safety concerns.    Discharge Diet   DIET DYS 2 Room service appropriate?: Yes; Fluid consistency:: Thin liquids  DISCHARGE PLAN  Disposition:  HOME WITH WIFE, HH PT AND OT   Eliquis (apixaban) daily for secondary stroke prevention. Asked to follow up with PCP/cardiologist for prior auth after the first of the year.  Follow-up No primary care provider on file. in 2 weeks.  Follow-up cardiology  Follow-up with Dr. Rosalin Hawking, Stroke Clinic in 2 months.  45 minutes were spent preparing discharge.  [place] for Pager information 12/21/2015 1:54 PM    I, the attending vascular neurologist, have personally obtained a history, examined the patient, evaluated laboratory data, individually viewed imaging studies and agree with radiology interpretations. I also discussed with wife regarding his care plan. Together with the NP/PA, we formulated the assessment and plan of care which reflects our mutual decision.  I have made  any additions or clarifications directly to the above note and agree with the findings and plan as currently documented.    Rosalin Hawking, MD PhD Stroke Neurology 12/23/2015 6:27 AM

## 2015-12-21 NOTE — Progress Notes (Signed)
Physical Therapy Treatment Patient Details Name: Ryan Payne MRN: [id number] DOB: April 18, 1926 Today's Date: 12/21/2015    History of Present Illness Pt is an 81 y/o male with a PMH of HTN, dementia. Pt presents to the ED s/p acute onset of slurred speech and R-sided weakness. He was found to be in a-fib after arriving in the ED. CT was negative however MRI revealed small areas of acute ischemia L frontal lobe/MCA territory.     PT Comments    Patient/wife preparing to discharge home (when ride arrives). Wife eager for pt to attempt stairs. Education completed and wife satisfied that she will be able to get him into the house.  Follow Up Recommendations  Home health PT;Supervision/Assistance - 24 hour     Equipment Recommendations  None recommended by PT    Recommendations for Other Services       Precautions / Restrictions Precautions Precautions: Fall Precaution Comments: Has been violent and agressive with staff at times.  Restrictions Weight Bearing Restrictions: No    Mobility  Bed Mobility               General bed mobility comments: Pt sitting up in the recliner upon PT arrival.   Transfers Overall transfer level: Needs assistance Equipment used: Straight cane Transfers: Sit to/from Stand Sit to Stand: Min guard         General transfer comment: no imbalance; incr time  Ambulation/Gait Ambulation/Gait assistance: Min guard Ambulation Distance (Feet): 100 Feet Assistive device: Straight cane Gait Pattern/deviations: Step-through pattern;Decreased stride length;Shuffle;Trunk flexed Gait velocity: Decreased   General Gait Details: does not sequence correctly with cane, however it is not an impediment    Stairs Stairs: Yes Stairs assistance: Min guard Stair Management: One rail Right;Step to pattern;Forwards Number of Stairs: 5 General stair comments: attempted to simulate home steps with use of cane and HHA, however pt kept using rail that was  present (shaking off attempts at [facility]); gave him something to carry with cane and he stiill managed to use rail  Wheelchair Mobility    Modified Rankin (Stroke Patients Only) Modified Rankin (Stroke Patients Only) Pre-Morbid Rankin Score: No significant disability Modified Rankin: Moderately severe disability     Balance Overall balance assessment: Needs assistance   Sitting balance-Leahy Scale: Fair       Standing balance-Leahy Scale: Fair                      Cognition Arousal/Alertness: Awake/alert Behavior During Therapy: WFL for tasks assessed/performed Overall Cognitive Status: History of cognitive impairments - at baseline (per wife)                      Exercises      General Comments        Pertinent Vitals/Pain Pain Assessment: No/denies pain    Home Living                      Prior Function            PT Goals (current goals can now be found in the care plan section) Acute Rehab PT Goals Patient Stated Goal: wife wanted to practice steps prior to d/c Time For Goal Achievement: 01/02/16 Progress towards PT goals: Progressing toward goals    Frequency  Min 4X/week    PT Plan Current plan remains appropriate    Co-evaluation             End of  Session Equipment Utilized During Treatment: Gait belt Activity Tolerance: Patient tolerated treatment well Patient left: in chair;with call bell/phone within reach;with family/visitor present     Time: [id number] PT Time Calculation (min) (ACUTE ONLY): 15 min  Charges:  $Gait Training: 8-22 mins                    G Codes:      Dearia [place] 02-Jan-2016, 4:03 PM Pager [phone]

## 2015-12-21 NOTE — Discharge Instructions (Signed)

## 2015-12-21 NOTE — Progress Notes (Signed)
Occupational Therapy Treatment Patient Details Name: Ryan Payne MRN: [id number] DOB: 1926-01-20 Today's Date: 12/21/2015    History of present illness Pt is an 81 y/o male with a PMH of HTN, dementia. Pt presents to the ED s/p acute onset of slurred speech and R-sided weakness. He was found to be in a-fib after arriving in the ED. CT was negative however MRI revealed small areas of acute ischemia L frontal lobe/MCA territory.    OT comments  Pt is making good progress towards OT goals. Pt required min-min guard assist with all ADLs and mobility during session today and min verbal cues to sequence completely through tasks. Began session using RW for mobility, but pt stated that he ambulates without an AD at home, so progressed session to ambulating independently - pt did well and required min guard assist for slight unsteadiness. Wife states that pt is functioning close to his baseline. Still recommending HHOT for ADL retraining and fall risk evaluation. Will continue to follow acutely.   Follow Up Recommendations  Home health OT;Supervision/Assistance - 24 hour    Equipment Recommendations  None recommended by OT    Recommendations for Other Services      Precautions / Restrictions Precautions Precautions: Fall Restrictions Weight Bearing Restrictions: No       Mobility Bed Mobility Overal bed mobility: Needs Assistance Bed Mobility: Supine to Sit     Supine to sit: Min assist;HOB elevated     General bed mobility comments: HOB slightly elevated on pt's request and hand held assist to come to sitting. No physical assist required to scoot hips to EOB.  Transfers Overall transfer level: Needs assistance Equipment used: Rolling walker (2 wheeled) (initially) Transfers: Sit to/from Stand Sit to Stand: Min guard         General transfer comment: Min guard for safety due to slight unsteadiness upon standing. Good demonstration of safe hand placement on seated surfaces     Balance Overall balance assessment: Needs assistance Sitting-balance support: No upper extremity supported;Feet supported Sitting balance-Leahy Scale: Fair     Standing balance support: No upper extremity supported;During functional activity Standing balance-Leahy Scale: Fair Standing balance comment: Able to complete static and dynamic standing tasks without UE support for ~5-7 minutes                   ADL Overall ADL's : Needs assistance/impaired Eating/Feeding: Set up;Sitting   Grooming: Wash/dry hands;Min guard;Standing           Upper Body Dressing : Supervision/safety;Sitting   Lower Body Dressing: Minimal assistance;Sitting/lateral leans Lower Body Dressing Details (indicate cue type and reason): Min assist to doff socks - pt able to don socks without assist Toilet Transfer: Min guard;Ambulation;Regular Toilet;Grab bars;RW   Toileting- Clothing Manipulation and Hygiene: Minimal assistance;Sit to/from stand Toileting - Clothing Manipulation Details (indicate cue type and reason): Min assist to move gown before sitting - possibly due to inability to follow commands consistently? Tub/ Shower Transfer: Walk-in shower;Min guard;Ambulation;Grab bars   Functional mobility during ADLs: Min guard General ADL Comments: Began session using RW but pt does not use AD in house at home so progressed to ambulating during ADLs without AD. Min guard for ADLs and mobility with no AD due to some unsteadiness. Pt able to complete transfers and grooming tasks with minimal verbal cues to sequence through entire tasks. Pt's wife reports that he is functioning vey close to his baseline.      Vision  Perception     Praxis      Cognition   Behavior During Therapy: WFL for tasks assessed/performed Overall Cognitive Status: History of cognitive impairments - at baseline                       Extremity/Trunk Assessment                Exercises     Shoulder Instructions       General Comments      Pertinent Vitals/ Pain       Pain Assessment: No/denies pain  Home Living                                          Prior Functioning/Environment              Frequency Min 2X/week     Progress Toward Goals  OT Goals(current goals can now be found in the care plan section)  Progress towards OT goals: Progressing toward goals  Acute Rehab OT Goals Patient Stated Goal: To go home today OT Goal Formulation: With patient/family Time For Goal Achievement: 01/02/16 Potential to Achieve Goals: Good ADL Goals Pt Will Perform Grooming: with min guard assist;standing Pt Will Perform Upper Body Bathing: with set-up;sitting;with supervision Pt Will Perform Lower Body Bathing: with min guard assist;sit to/from stand Pt Will Perform Upper Body Dressing: with set-up;sitting;with supervision Pt Will Perform Lower Body Dressing: with min guard assist;sit to/from stand Pt Will Transfer to Toilet: with min guard assist;ambulating;regular height toilet;bedside commode;grab bars Pt Will Perform Toileting - Clothing Manipulation and hygiene: with min guard assist;sit to/from stand  Plan Discharge plan remains appropriate    Co-evaluation                 End of Session Equipment Utilized During Treatment: Gait belt;Rolling walker   Activity Tolerance Patient tolerated treatment well   Patient Left in chair;with call bell/phone within reach;with chair alarm set;with family/visitor present   Nurse Communication Mobility status        Time: [id number] OT Time Calculation (min): 31 min  Charges: OT General Charges $OT Visit: 1 Procedure OT Treatments $Self Care/Home Management : 23-37 mins  Redmond Baseman, OTR/L 12/21/2015, 11:16 AM Pager: [phone]

## 2015-12-21 NOTE — Patient Instructions (Signed)
Narrowing Stance: Eyes Open and Eyes Shut    Stand facing forward, eyes open.  Stand 5 seconds then bring feet slightly closer together. Repeat until feet are touching.  Repeat 5 times.  To challenge:  Try with eyes closed.    Alternating Arm Swings    Standing in neutral posture, on floor, swing one arm forward, the other back. Try to keep body still.  Do 10  Repetitions. To challenge:  Try using a weight or try standing on a pillow.       Leg Curl - With Support      Holding onto back of chair, or something stable.  Kick one leg back.  Then repeat with other leg, alternating legs.  Do 10 repetitions.   To challenge:  Try to raise hands off of chair.     Toe / Heel Raise    Holding onto back of chair or something stable, gently rock back on heels and raise toes. Then rock forward on toes and raise heels. Repeat sequence 10 times.   To challenge:  Try to raise hands off of chair.      FUNCTIONAL MOBILITY: Marching - Standing    Holding onto back of chair or something stable,march in place by lifting left leg up, then right. Alternate. Repeat 10 times. To challenge:  Try to raise hands off of chair/surface.     [place]    Using support, cross one foot over other. Then bring back foot up beside front foot. Repeat, going the other direction. Repeat 10 times.      Tandem Stance    Holding onto a chair or stable surface with one hand, place right foot in front of left, heel touching toe and both feet "straight ahead". Balance in this position 10 seconds.  Repeat with left foot in front of right. To challenge:  Try to left hands off chair.    Hip Abduction: Standing Side Leg Lift (Eccentric)    Holding onto chair or stable surface, lift leg out to side quickly. Slowly lower for 3-5 seconds. Repeat 10 times each side. To challenge:  Try to lift hands off chair.     SINGLE LIMB STANCE     Holding onto chair or stable surface with one hand,  try lifting one leg and balancing on the other.  Hold for 5 seconds.  Repeat with other leg. To challenge:  Try to lift hand off chair.   Soft Mat: Rocking Anterior / Posterior    **Make sure someone is standing with you to guard you! Balancing on a soft mat or pad such as folded towels, feet hip width apart, rock body forward and backward without moving feet. Hold each position 2 seconds.  Do 10 repetitions.         Turning in Place: Solid Surface     **Make sure someone is standing with you to guard you! Standing in place, lead with head and turn slowly making quarter turns toward left. Repeat 5 times.      

## 2015-12-27 ENCOUNTER — Other Ambulatory Visit: Payer: Self-pay

## 2015-12-27 NOTE — Patient Outreach (Signed)
Verbal consent received from patient's wife to receive [facility] calls and for outreach for modified rankin score in 75 days. [facility] calls begin 12/28/15.  [facility] will follow-up as needed per daily dashboard report. Kenney Houseman,  Care Management [phone]

## 2016-01-07 ENCOUNTER — Telehealth: Payer: Self-pay

## 2016-01-07 NOTE — Telephone Encounter (Signed)
Rn call [facility] at [phone] about patients plan of care,and PT orders. Rn explain patient has follow up with Dr.Xu in March for hospital follow up. Rn explain Dr. Erlinda Hong will return to office on January 23 Lorie from [facility] stated that was fine.

## 2016-01-07 NOTE — Telephone Encounter (Signed)
Pt will be a new hospital follow up stroke patient.

## 2016-03-11 ENCOUNTER — Ambulatory Visit: Payer: Medicare Other | Admitting: Neurology

## 2016-03-12 ENCOUNTER — Encounter: Payer: Self-pay | Admitting: Neurology

## 2016-03-28 ENCOUNTER — Other Ambulatory Visit: Payer: Self-pay

## 2016-03-28 NOTE — Patient Outreach (Signed)
Telephone outreach to patient to obtain mRS was successfully completed by patient and patient's wife. mRS = 3

## 2016-05-05 ENCOUNTER — Emergency Department
Admission: EM | Admit: 2016-05-05 | Discharge: 2016-05-05 | Disposition: A | Discharge status: Home / Self Care | Payer: Medicare Other | Attending: Emergency Medicine | Admitting: Emergency Medicine

## 2016-05-05 ENCOUNTER — Encounter: Payer: Self-pay | Admitting: Medical Oncology

## 2016-05-05 ENCOUNTER — Emergency Department: Payer: Medicare Other

## 2016-05-05 DIAGNOSIS — I4891 Unspecified atrial fibrillation: Secondary | ICD-10-CM | POA: Insufficient documentation

## 2016-05-05 DIAGNOSIS — S300XXA Contusion of lower back and pelvis, initial encounter: Secondary | ICD-10-CM | POA: Diagnosis not present

## 2016-05-05 DIAGNOSIS — I10 Essential (primary) hypertension: Secondary | ICD-10-CM | POA: Diagnosis not present

## 2016-05-05 DIAGNOSIS — Z79899 Other long term (current) drug therapy: Secondary | ICD-10-CM | POA: Diagnosis not present

## 2016-05-05 DIAGNOSIS — W010XXA Fall on same level from slipping, tripping and stumbling without subsequent striking against object, initial encounter: Secondary | ICD-10-CM | POA: Insufficient documentation

## 2016-05-05 DIAGNOSIS — Y999 Unspecified external cause status: Secondary | ICD-10-CM | POA: Insufficient documentation

## 2016-05-05 DIAGNOSIS — Z8673 Personal history of transient ischemic attack (TIA), and cerebral infarction without residual deficits: Secondary | ICD-10-CM | POA: Diagnosis not present

## 2016-05-05 DIAGNOSIS — Y929 Unspecified place or not applicable: Secondary | ICD-10-CM | POA: Insufficient documentation

## 2016-05-05 DIAGNOSIS — Y939 Activity, unspecified: Secondary | ICD-10-CM | POA: Insufficient documentation

## 2016-05-05 DIAGNOSIS — R0789 Other chest pain: Secondary | ICD-10-CM | POA: Diagnosis present

## 2016-05-05 DIAGNOSIS — W19XXXA Unspecified fall, initial encounter: Secondary | ICD-10-CM

## 2016-05-05 DIAGNOSIS — E785 Hyperlipidemia, unspecified: Secondary | ICD-10-CM | POA: Diagnosis not present

## 2016-05-05 DIAGNOSIS — S20312A Abrasion of left front wall of thorax, initial encounter: Secondary | ICD-10-CM | POA: Insufficient documentation

## 2016-05-05 HISTORY — DX: Cerebral infarction, unspecified: I63.9

## 2016-05-05 MED ORDER — ACETAMINOPHEN 325 MG PO TABS
650.0000 mg | ORAL_TABLET | Freq: Once | ORAL | Status: AC
  Administered 2016-05-05: 650 mg via ORAL
  Filled 2016-05-05: qty 2

## 2016-05-05 NOTE — ED Provider Notes (Addendum)
[facility] Emergency Department Provider Note  ____________________________________________   I have reviewed the triage vital signs and the nursing notes.   HISTORY  Chief Complaint Fall    HPI Ryan Payne is a 82 y.o. male states he got out of bed this morning at 9:00 in the morning and tripped over a stool and fell landing on his bottom. Did not hit his head. Denies any numbness or weakness abdominal pain shortness of breath or headache. Remembers the fall. There was no syncope. There is no prodrome or subsequent symptoms of any variety. He states the only other issue is that he landed on his bottom and his bottom hurts, and, he has an abrasion to the rib cage on the left. He has no trouble breathing, no numbness no weakness.      Past Medical History  Diagnosis Date  . Hypertension   . Dementia   . CVA (cerebral infarction)     Patient Active Problem List   Diagnosis Date Noted  . Atrial fibrillation ([place]) 12/21/2015  . Acute delirium 12/21/2015  . Dementia with behavioral disturbance 12/21/2015  . Essential hypertension 12/21/2015  . Hyperlipidemia LDL goal <70 12/21/2015  . Obesity 12/21/2015  . Family history of stroke 12/21/2015  . CVA (cerebral infarction) - left frontal lobe/MCA territory infarcts, embolic secondary to new onset atrial fibrillation 12/17/2015    History reviewed. No pertinent past surgical history.  Current Outpatient Rx  Name  Route  Sig  Dispense  Refill  . acetaminophen (TYLENOL) 500 MG tablet   Oral   Take 1,000 mg by mouth every 6 (six) hours as needed for moderate pain.         Marland Kitchen amLODipine (NORVASC) 5 MG tablet   Oral   Take 5 mg by mouth at bedtime.      11   . apixaban (ELIQUIS) 5 MG TABS tablet   Oral   Take 1 tablet (5 mg total) by mouth 2 (two) times daily.   60 tablet   2   . atorvastatin (LIPITOR) 20 MG tablet   Oral   Take 1 tablet (20 mg total) by mouth daily at 6 PM.   30  tablet   2   . donepezil (ARICEPT ODT) 10 MG disintegrating tablet   Oral   Take 10 mg by mouth at bedtime.      5   . QUEtiapine (SEROQUEL) 50 MG tablet   Oral   Take 1 tablet (50 mg total) by mouth at bedtime.   30 tablet   2     Allergies Morphine and related  No family history on file.  Social History Social History  Substance Use Topics  . Smoking status: Never Smoker   . Smokeless tobacco: None  . Alcohol Use: No    Review of Systems Constitutional: No fever/chills Eyes: No visual changes. ENT: No sore throat. No stiff neck no neck pain Cardiovascular: Denies chest pain. Respiratory: Denies shortness of breath. Gastrointestinal:   no vomiting.  No diarrhea.  No constipation. Genitourinary: Negative for dysuria. Musculoskeletal: Negative lower extremity swelling Skin: Negative for rash. Neurological: Negative for headaches, focal weakness or numbness. 10-point ROS otherwise negative.  ____________________________________________   PHYSICAL EXAM:  VITAL SIGNS: ED Triage Vitals  Enc Vitals Group     BP 05/05/16 1554 141/74 mmHg     Pulse Rate 05/05/16 1554 72     Resp 05/05/16 1554 18     Temp 05/05/16 1554 98  F (36.7 C)     Temp Source 05/05/16 1554 Oral     SpO2 05/05/16 1554 98 %     Weight 05/05/16 1554 195 lb (88.451 kg)     Height 05/05/16 1554 5\' 7"  (1.702 m)     Head Cir --      Peak Flow --      Pain Score 05/05/16 1554 10     Pain Loc --      Pain Edu? --      Excl. in [place]? --     Constitutional: Alert and oriented. Well appearing and in no acute distress. Eyes: Conjunctivae are normal. PERRL. EOMI. Head: Atraumatic. Nose: No congestion/rhinnorhea. Mouth/Throat: Mucous membranes are moist.  Oropharynx non-erythematous. Neck: No stridor.   Nontender with no meningismus Cardiovascular: Normal rate, regular rhythm. Grossly normal heart sounds.  Good peripheral circulation. Chest: There is an abrasion to the left chest wall, there is  no obvious rib fractures mildly tender to palpation no crepitus. Respiratory: Normal respiratory effort.  No retractions. Lungs CTAB. Abdominal: Soft and nontender. No distention. No guarding no rebound Back:  There is no focal tenderness or step off there is no midline tenderness there are no lesions noted Aside from tenderness to palpation around the coccyx with no evidence of open skin, there is no back pain or tenderness. Step-off no deformity. there is no CVA tenderness Musculoskeletal: No lower extremity tenderness. No joint effusions, no DVT signs strong distal pulses no edema is range of motion of hips. No upper extremity injury noted Neurologic:  Normal speech and language. No gross focal neurologic deficits are appreciated.  Skin:  Skin is warm, dry and intact. No rash noted. Psychiatric: Mood and affect are normal. Speech and behavior are normal.  ____________________________________________   LABS (all labs ordered are listed, but only abnormal results are displayed)  Labs Reviewed - No data to display ____________________________________________  EKG  I personally interpreted any EKGs ordered by me or triage  ____________________________________________  RADIOLOGY  I reviewed any imaging ordered by me or triage that were performed during my shift and, if possible, patient and/or family made aware of any abnormal findings. ____________________________________________   PROCEDURES  Procedure(s) performed: None  Critical Care performed: None  ____________________________________________   INITIAL IMPRESSION / ASSESSMENT AND PLAN / ED COURSE  Pertinent labs & imaging results that were available during my care of the patient were reviewed by me and considered in my medical decision making (see chart for details).  Non-syncopal fall with a superficial abrasion to the left chest wall with no evidence of abdominal pain to suggest splenic injury pneumothorax or  significant rib fracture although a cracked rib is difficult to determine on exam it does not seem clinically indicated. In addition, patient has some coccygeal pain but no evidence of fracture of the lumbar spine neurologically intact no evidence of hip fracture, patient fell on his bottom at 9:00 this morning and did not hit his head because he is 86 and on anticoagulation we did CT his head which is negative.. We will treat him with with Tylenol. According to family he cannot take anything stronger. Patient was walking without his cane today and lost his footing of advised him to keep his cane with him at all times. He is able to and really with no difficulty in the department at this time. He is eager to go home. We will send him with incentive spirometry for his possible rib injury and return precautions and  given understood. ____________________________________________   FINAL CLINICAL IMPRESSION(S) / ED DIAGNOSES  Final diagnoses:  None      This chart was dictated using voice recognition software.  Despite best efforts to proofread,  errors can occur which can change meaning.     Schuyler Amor, MD 05/05/16 [place], MD 05/05/16 [place], MD 05/05/16 [phone]

## 2016-05-05 NOTE — ED Notes (Signed)
Pt fell this am by tripping over a stool, pt reports pain to left ribs, with abrasion noted. Pt also reports pain to tail bone. Pt a/o x 4. Denies hitting head, takes eliquis at home.

## 2016-05-05 NOTE — ED Notes (Signed)
Pt in Xray at this time.

## 2016-05-05 NOTE — Discharge Instructions (Signed)
Chest Wall Pain °Chest wall pain is pain in or around the bones and muscles of your chest. Sometimes, an injury causes this pain. Sometimes, the cause may not be known. This pain may take several weeks or longer to get better. °HOME CARE °Pay attention to any changes in your symptoms. Take these actions to help with your pain: °· Rest as told by your doctor. °· Avoid activities that cause pain. Try not to use your chest, belly (abdominal), or side muscles to lift heavy things. °· If directed, apply ice to the painful area: °¨ Put ice in a plastic bag. °¨ Place a towel between your skin and the bag. °¨ Leave the ice on for 20 minutes, 2-3 times per day. °· Take over-the-counter and prescription medicines only as told by your doctor. °· Do not use tobacco products, including cigarettes, chewing tobacco, and e-cigarettes. If you need help quitting, ask your doctor. °· Keep all follow-up visits as told by your doctor. This is important. °GET HELP IF: °· You have a fever. °· Your chest pain gets worse. °· You have new symptoms. °GET HELP RIGHT AWAY IF: °· You feel sick to your stomach (nauseous) or you throw up (vomit). °· You feel sweaty or light-headed. °· You have a cough with phlegm (sputum) or you cough up blood. °· You are short of breath. °  °This information is not intended to replace advice given to you by your health care provider. Make sure you discuss any questions you have with your health care provider. °  °Document Released: 05/26/2008 Document Revised: 08/29/2015 Document Reviewed: 03/05/2015 °Elsevier Interactive Patient Education ©2016 [employer] ° °

## 2016-05-05 NOTE — ED Notes (Signed)
Patient transported to CT 

## 2016-08-20 ENCOUNTER — Other Ambulatory Visit: Payer: Self-pay | Admitting: Internal Medicine

## 2016-08-20 DIAGNOSIS — F0391 Unspecified dementia with behavioral disturbance: Secondary | ICD-10-CM

## 2016-08-20 DIAGNOSIS — G479 Sleep disorder, unspecified: Secondary | ICD-10-CM

## 2016-08-21 ENCOUNTER — Ambulatory Visit
Admission: RE | Admit: 2016-08-21 | Discharge: 2016-08-21 | Disposition: A | Discharge status: Home / Self Care | Payer: Medicare Other | Source: Ambulatory Visit | Attending: Internal Medicine | Admitting: Internal Medicine

## 2016-08-21 DIAGNOSIS — I6782 Cerebral ischemia: Secondary | ICD-10-CM | POA: Diagnosis not present

## 2016-08-21 DIAGNOSIS — F0151 Vascular dementia with behavioral disturbance: Secondary | ICD-10-CM | POA: Insufficient documentation

## 2016-08-21 DIAGNOSIS — I639 Cerebral infarction, unspecified: Secondary | ICD-10-CM | POA: Insufficient documentation

## 2016-08-21 DIAGNOSIS — G479 Sleep disorder, unspecified: Secondary | ICD-10-CM | POA: Diagnosis not present

## 2016-08-21 DIAGNOSIS — F0391 Unspecified dementia with behavioral disturbance: Secondary | ICD-10-CM

## 2016-08-21 DIAGNOSIS — G319 Degenerative disease of nervous system, unspecified: Secondary | ICD-10-CM | POA: Diagnosis not present

## 2017-06-30 ENCOUNTER — Other Ambulatory Visit: Payer: Self-pay | Admitting: Internal Medicine

## 2017-06-30 DIAGNOSIS — M533 Sacrococcygeal disorders, not elsewhere classified: Secondary | ICD-10-CM

## 2017-06-30 DIAGNOSIS — C679 Malignant neoplasm of bladder, unspecified: Secondary | ICD-10-CM

## 2017-07-08 ENCOUNTER — Ambulatory Visit: Payer: Medicare Other

## 2017-07-09 ENCOUNTER — Ambulatory Visit: Admission: RE | Admit: 2017-07-09 | Payer: Medicare Other | Source: Ambulatory Visit

## 2017-07-16 ENCOUNTER — Ambulatory Visit: Admission: RE | Admit: 2017-07-16 | Payer: Medicare Other | Source: Ambulatory Visit

## 2017-07-20 ENCOUNTER — Ambulatory Visit
Admission: RE | Admit: 2017-07-20 | Discharge: 2017-07-20 | Disposition: A | Discharge status: Home / Self Care | Payer: Medicare Other | Source: Ambulatory Visit | Attending: Internal Medicine | Admitting: Internal Medicine

## 2017-07-20 DIAGNOSIS — Z8551 Personal history of malignant neoplasm of bladder: Secondary | ICD-10-CM | POA: Insufficient documentation

## 2017-07-20 DIAGNOSIS — M533 Sacrococcygeal disorders, not elsewhere classified: Secondary | ICD-10-CM | POA: Diagnosis present

## 2017-07-20 DIAGNOSIS — K5730 Diverticulosis of large intestine without perforation or abscess without bleeding: Secondary | ICD-10-CM | POA: Diagnosis not present

## 2017-07-20 DIAGNOSIS — M47816 Spondylosis without myelopathy or radiculopathy, lumbar region: Secondary | ICD-10-CM | POA: Diagnosis not present

## 2017-07-20 DIAGNOSIS — C679 Malignant neoplasm of bladder, unspecified: Secondary | ICD-10-CM

## 2017-07-20 MED ORDER — GADOBENATE DIMEGLUMINE 529 MG/ML IV SOLN
19.0000 mL | Freq: Once | INTRAVENOUS | Status: AC | PRN
  Administered 2017-07-20: 19 mL via INTRAVENOUS

## 2017-08-22 DEATH — deceased

## 2018-04-10 IMAGING — MR MR SACRUM / SI JOINTS WO/W CM
7 of 8 series · 38 of 48 positions shown · IV contrast (multihance)
Comparison: Plain films of the sacrum 05/05/2016.

CLINICAL DATA: Coccygeal pain in patient with a history of bladder
carcinoma. No known injury.

EXAM:
MRI LUMBAR SPINE WITHOUT AND WITH CONTRAST
TECHNIQUE: Multiplanar and multiecho pulse sequences of the lumbar spine were
obtained without and with intravenous contrast.
CONTRAST:  19 cc MultiHance IV.

[Series 2: T1 · sagittal · 5.0mm · 0.47mm/px · 5 of 33 slices shown (1 of 3)]
[im 1/33]
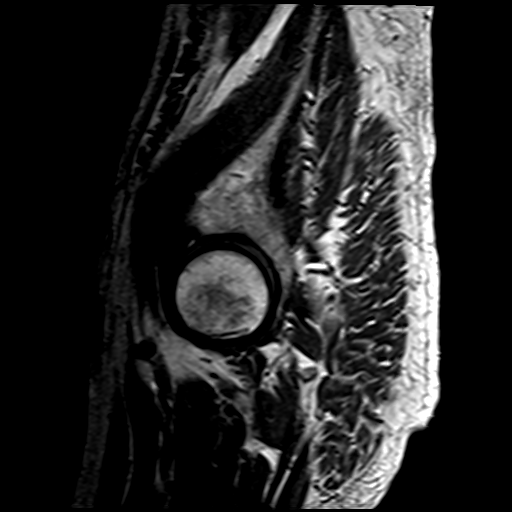
[im 9/33]
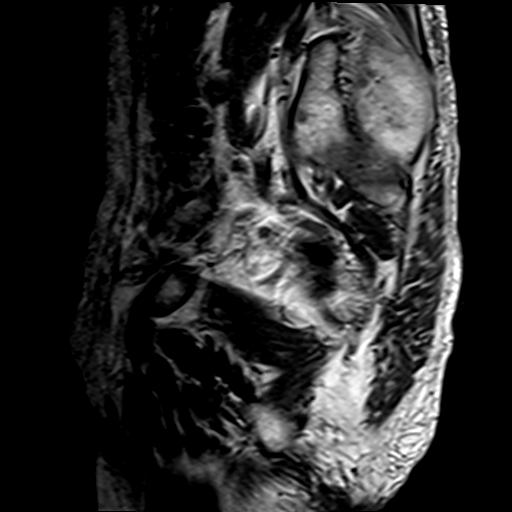
[im 17/33]
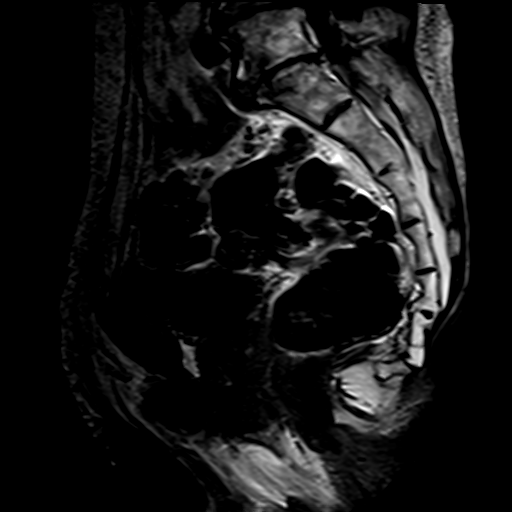
[im 25/33]
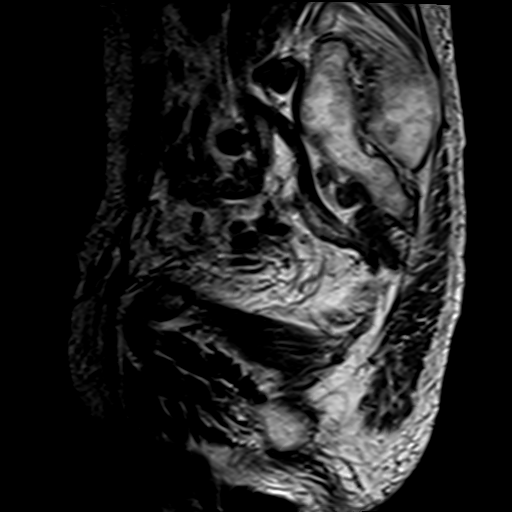
[im 33/33]
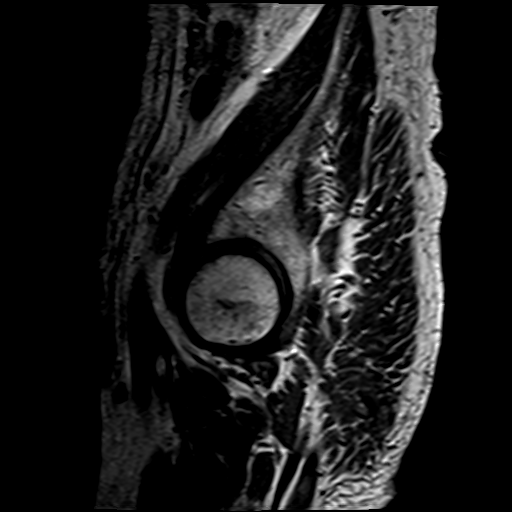

[Series 3: T1 · coronal · 5.0mm · 0.94mm/px · 4 of 24 slices shown (2 of 3)]
[im 1/24]
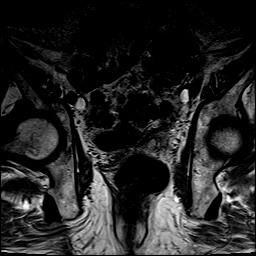
[im 8/24]
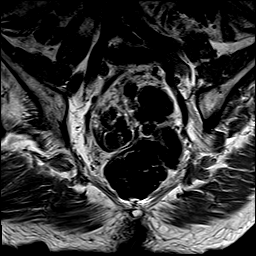
[im 16/24]
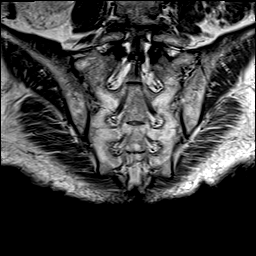
[im 24/24]
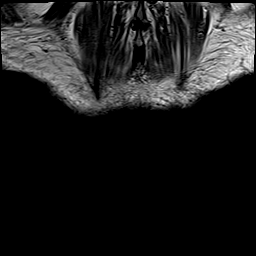

[Series 4: T1 · axial · 5.0mm · 0.82mm/px · z∈[-112,+62]mm · 7 of 35 slices shown (3 of 3)]
[im 1/35]
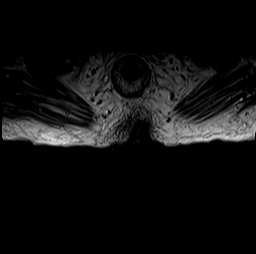
[im 6/35]
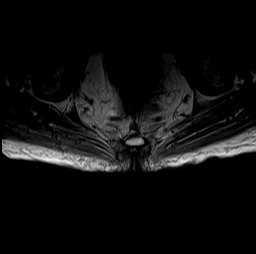
[im 12/35]
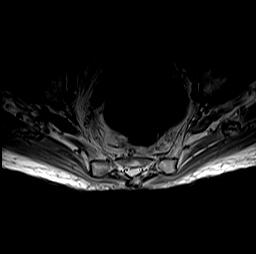
[im 18/35]
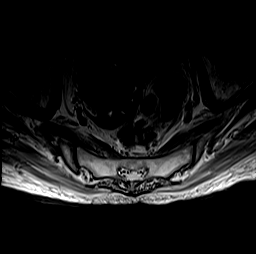
[im 23/35]
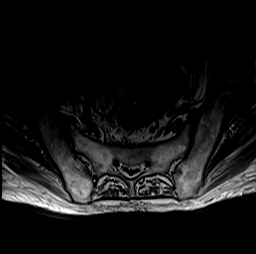
[im 29/35]
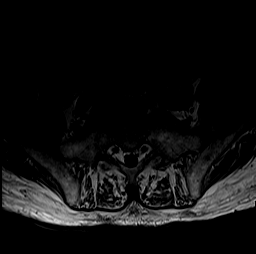
[im 35/35]
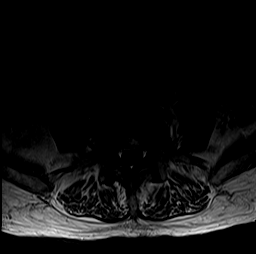

[Series 5: T1 fat-sat · axial · 5.0mm · 0.82mm/px · z∈[-112,+62]mm · 7 of 35 slices shown]
[im 1/35]
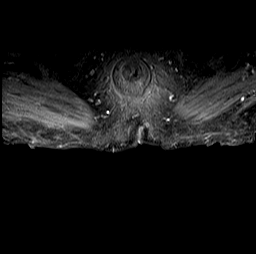
[im 6/35]
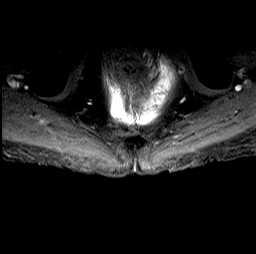
[im 12/35]
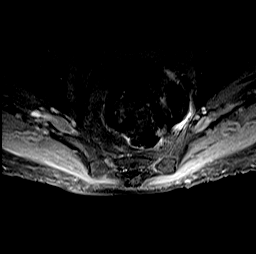
[im 18/35]
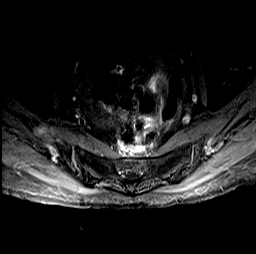
[im 23/35]
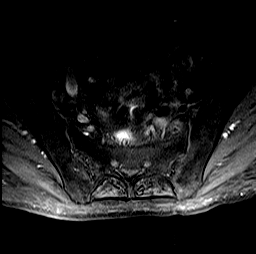
[im 29/35]
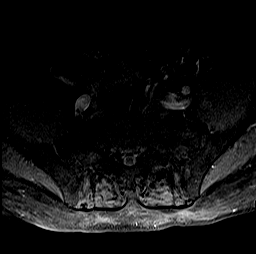
[im 35/35]
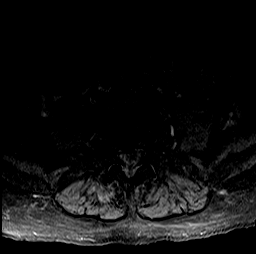

[Series 6: T2 fat-sat · axial · 5.0mm · 0.41mm/px · z∈[-108,+66]mm · 7 of 35 slices shown]
[im 1/35]
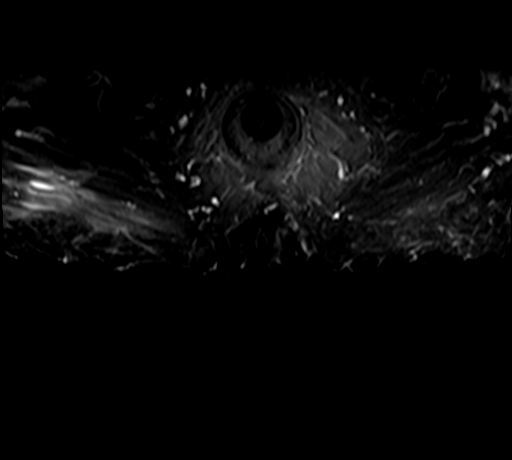
[im 6/35]
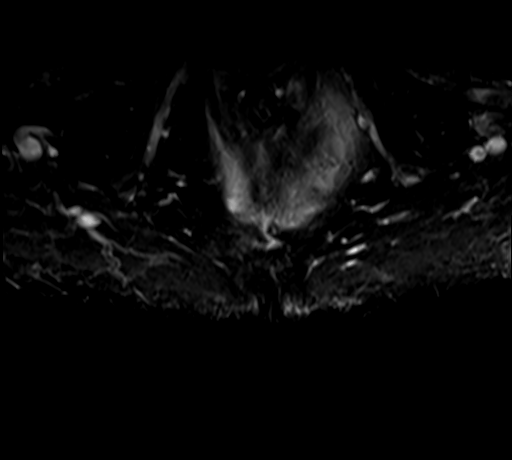
[im 12/35]
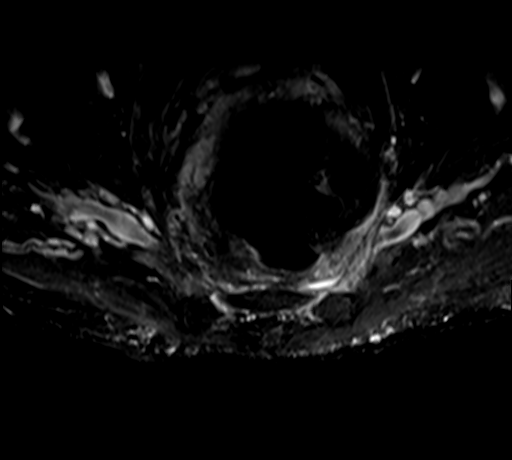
[im 18/35]
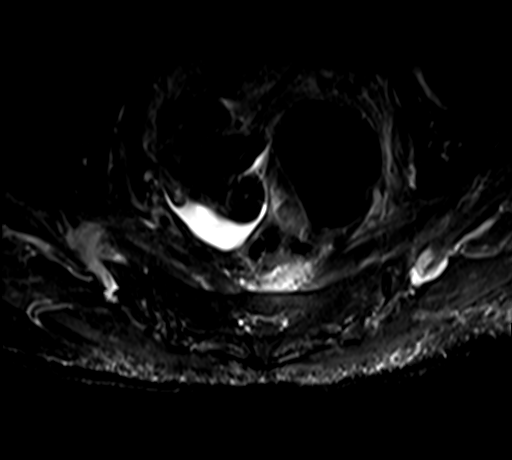
[im 23/35]
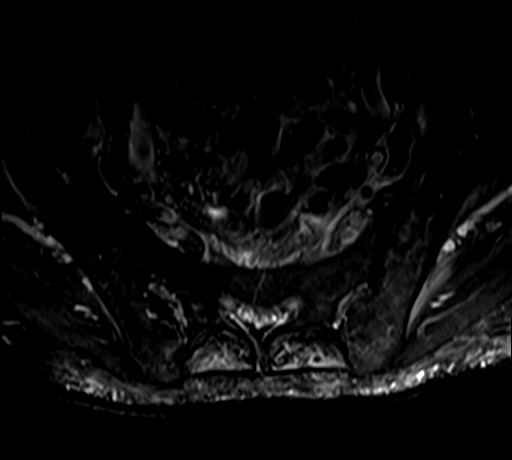
[im 29/35]
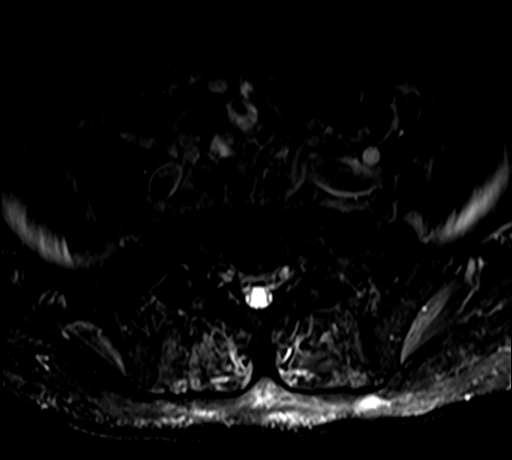
[im 35/35]
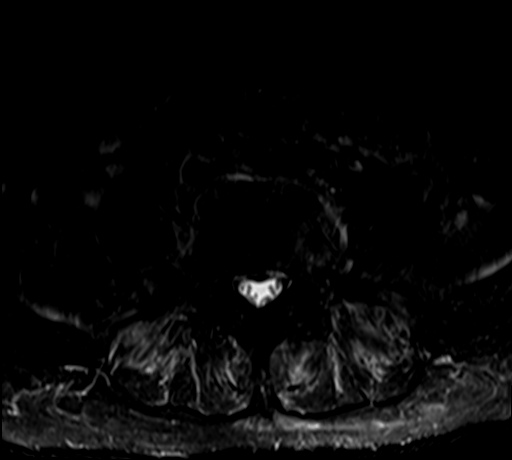

[Series 7: STIR · coronal · 5.0mm · 0.94mm/px · 5 of 24 slices shown]
[im 1/24]
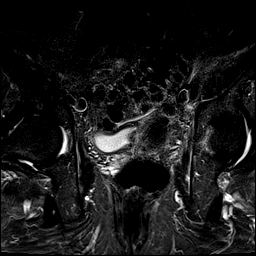
[im 6/24]
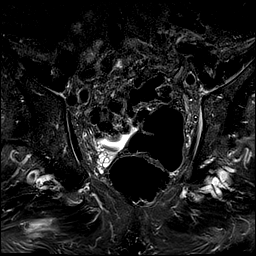
[im 12/24]
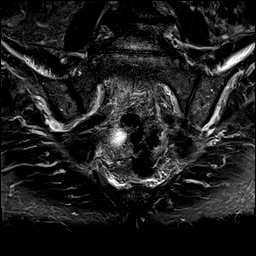
[im 18/24]
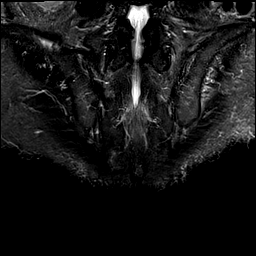
[im 24/24]
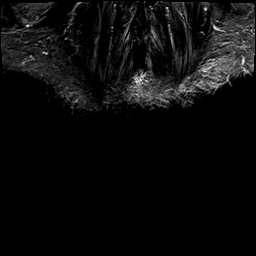

[Series 8: T1 fat-sat post-contrast · axial · 5.0mm · 0.82mm/px · z∈[-112,-56]mm · 3 of 35 slices shown]
[im 1/35]
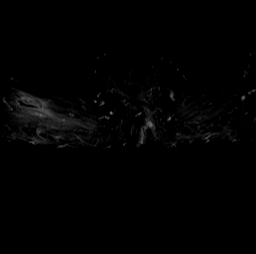
[im 6/35]
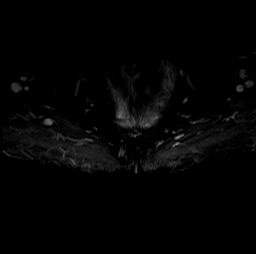
[im 12/35]
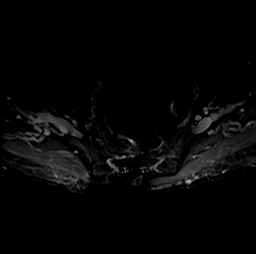

[38 of 48 positions shown; findings below may reference images not displayed]

FINDINGS: Segmentation:  Standard.

Alignment:  Normal.

Vertebrae: Marrow signal throughout the sacrum and coccyx is normal.
There is no evidence of metastatic disease. No fracture, stress
change or focal lesion is identified. Degenerative endplate signal
change and endplate spurring are seen at L4-5 and L5-S1. The central
canal appears open at L5-S1 but there is mild to moderate bilateral
foraminal narrowing which appears worse on the right. L4-5 is
incompletely imaged.

Paraspinal and other soft tissues: The sacral nerve plexus appears
normal. There is partial visualization of extensive appearing
sigmoid diverticulosis. A small volume of free pelvic fluid is
identified.

Musculature: All imaged musculature is intact without atrophy or
focal lesion.
IMPRESSION: Normal-appearing sacrum and coccyx. No finding to explain the
patient's coccygeal pain.

Degenerative disease L5-S1 where there appears to mild to moderate
foraminal narrowing, worse on the right.

Small volume of free pelvic fluid. The etiology of this fluid is
unknown.

Extensive sigmoid diverticulosis.
